# Patient Record
Sex: Female | Born: 1950 | Race: White | Hispanic: No | Smoking: Never smoker
Health system: Southern US, Community
[De-identification: ages and names within clinical notes are randomized; demographics above are authoritative.]

## PROBLEM LIST (undated history)

## (undated) DIAGNOSIS — M1990 Unspecified osteoarthritis, unspecified site: Secondary | ICD-10-CM

## (undated) DIAGNOSIS — M797 Fibromyalgia: Secondary | ICD-10-CM

## (undated) DIAGNOSIS — E039 Hypothyroidism, unspecified: Secondary | ICD-10-CM

## (undated) DIAGNOSIS — M316 Other giant cell arteritis: Secondary | ICD-10-CM

## (undated) DIAGNOSIS — M810 Age-related osteoporosis without current pathological fracture: Secondary | ICD-10-CM

## (undated) DIAGNOSIS — T7840XA Allergy, unspecified, initial encounter: Secondary | ICD-10-CM

## (undated) DIAGNOSIS — A770 Spotted fever due to Rickettsia rickettsii: Secondary | ICD-10-CM

## (undated) HISTORY — PX: BREAST SURGERY: SHX581

## (undated) HISTORY — DX: Unspecified osteoarthritis, unspecified site: M19.90

## (undated) HISTORY — DX: Spotted fever due to Rickettsia rickettsii: A77.0

## (undated) HISTORY — DX: Age-related osteoporosis without current pathological fracture: M81.0

## (undated) HISTORY — DX: Fibromyalgia: M79.7

## (undated) HISTORY — DX: Hypothyroidism, unspecified: E03.9

## (undated) HISTORY — DX: Allergy, unspecified, initial encounter: T78.40XA

## (undated) HISTORY — DX: Other giant cell arteritis: M31.6

---

## 1998-08-19 ENCOUNTER — Encounter: Payer: Self-pay | Admitting: Emergency Medicine

## 1998-08-19 ENCOUNTER — Ambulatory Visit (HOSPITAL_COMMUNITY): Admission: RE | Admit: 1998-08-19 | Discharge: 1998-08-19 | Payer: Self-pay | Admitting: Emergency Medicine

## 2000-05-09 ENCOUNTER — Encounter: Admission: RE | Admit: 2000-05-09 | Discharge: 2000-05-09 | Payer: Self-pay | Admitting: Emergency Medicine

## 2000-05-09 ENCOUNTER — Encounter: Payer: Self-pay | Admitting: Emergency Medicine

## 2000-05-31 ENCOUNTER — Encounter: Admission: RE | Admit: 2000-05-31 | Discharge: 2000-05-31 | Payer: Self-pay | Admitting: Emergency Medicine

## 2000-05-31 ENCOUNTER — Encounter: Payer: Self-pay | Admitting: Emergency Medicine

## 2000-10-03 ENCOUNTER — Encounter: Payer: Self-pay | Admitting: Neurological Surgery

## 2000-10-03 ENCOUNTER — Encounter: Admission: RE | Admit: 2000-10-03 | Discharge: 2000-10-03 | Payer: Self-pay | Admitting: Neurological Surgery

## 2001-01-30 ENCOUNTER — Encounter: Payer: Self-pay | Admitting: Emergency Medicine

## 2001-01-30 ENCOUNTER — Encounter: Admission: RE | Admit: 2001-01-30 | Discharge: 2001-01-30 | Payer: Self-pay | Admitting: Emergency Medicine

## 2001-10-27 ENCOUNTER — Encounter: Payer: Self-pay | Admitting: Emergency Medicine

## 2001-10-27 ENCOUNTER — Ambulatory Visit (HOSPITAL_COMMUNITY): Admission: RE | Admit: 2001-10-27 | Discharge: 2001-10-27 | Payer: Self-pay | Admitting: Emergency Medicine

## 2014-07-28 ENCOUNTER — Encounter: Payer: Self-pay | Admitting: Family Medicine

## 2014-07-28 ENCOUNTER — Ambulatory Visit (INDEPENDENT_AMBULATORY_CARE_PROVIDER_SITE_OTHER): Payer: Self-pay | Admitting: Family Medicine

## 2014-07-28 VITALS — BP 118/70 | HR 81 | Temp 98.3°F | Resp 16 | Ht 62.0 in | Wt 102.4 lb

## 2014-07-28 DIAGNOSIS — E039 Hypothyroidism, unspecified: Secondary | ICD-10-CM

## 2014-07-28 DIAGNOSIS — Z23 Encounter for immunization: Secondary | ICD-10-CM

## 2014-07-28 MED ORDER — LIOTHYRONINE SODIUM 25 MCG PO TABS: 25.0000 ug | ORAL_TABLET | Freq: Every day | ORAL | Status: DC

## 2014-07-28 MED ORDER — LEVOTHYROXINE SODIUM 75 MCG PO TABS: 75.0000 ug | ORAL_TABLET | Freq: Every day | ORAL | Status: DC

## 2014-07-28 NOTE — Progress Notes (Addendum)
Subjective:    Patient ID: Alyssa Grant, female    DOB: May 06, 1951, 63 y.o.   MRN: [id number] This chart was scribed for Ethelda Chick, MD by Gwenevere Abbot, ED scribe. This patient was seen in room Room/bed 21 and the patient's care was started at 9:14 AM.  HPI  HPI Comments:  Alyssa Grant is a 63 y.o. female who presents to [facility] to establish care.    Previous Provider: Dr. Lorenz Coaster; Pt scheduled appointments to check TSH levels.  Last Physical: 2010/03/07 Pap Smear: 03/07/04 Mammogram: Mar 07, 2009 Colonoscopy: Mar 07, 2000, No polyps. Bone Density: 2012-03-07, No osteopenia.  Flu Vaccine: Pt accepts flu vaccine and requesting today. Tetanus: 2006-03-07  Eye Exam: UTD, No glaucoma or cataracts Dentist: Pt attends every six months. Pt does not smoke. Pt has an occasional drink. Pt is allergic to sulfa drugs and penicillin.   Family History: Mother: Deceased at 11 y.o. h/o COPD. Pt had a oxygen depravation seizure that caused MI. CVA in late 60's and high cholesterol. Father: Deceased at 42 y.o.h/o prostate cancer, bone cancer, and lung cancer. HTN. 3 Sisters and 2 Brothers: 1 Sister deceased, who was supposed to use a C-Pap machine, and was prescribed codeine cough syrup, and passed away due to respiratory distress in her sleep. 1 sister with h/o cancer (lump behind the knee), treated with radiation chemo. Brother with a h/o obesity and HTN.  Husband: Deceased in 03-07-2012. 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   Chronic Health Issues: Seasonal Allergies: Pt takes Claritin seasonally PRN. 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 Pt takes ibuprofen PRN. Thyroid: Pt takes medication, and had done so since she was in her 30's. Pt currently takes synthroid. Pt has never had an ultra sound of her neck. Thyroid was checked by Dr. Lorenz Coaster in 2010/03/07.  Breast Surgery: Pt has a h/o cysts in her  breast. Pt denies hospitalization for any other issues.  Pt does not express any concerns for this visit, but just wanted to establish care.    Pt is a vegetarian. Pt reports that she obtains her protein from fish. Pt reports that she did consume beef, pork and chicken at one point, but became unable to do so because of her digestive system. Pt reports that she experiences diarrhea at times when she consumes peanuts.   Review of Systems  Constitutional: Negative for fever and chills.  HENT: Negative for hearing loss.   Respiratory: Negative for cough and shortness of breath.   Cardiovascular: Negative for chest pain.  Gastrointestinal: Negative for diarrhea and constipation.  Endocrine: Negative for cold intolerance, heat intolerance, polydipsia, polyphagia and polyuria.  Musculoskeletal: Positive for arthralgias.  Allergic/Immunologic: Positive for environmental allergies.  Neurological: Negative for tremors and headaches.       Objective:   Physical Exam  Nursing note and vitals reviewed. Constitutional: She is oriented to person, place, and time. She appears well-developed and well-nourished.  HENT:  Head: Normocephalic and atraumatic.  Right Ear: External ear normal.  Left Ear: External ear normal.  Eyes: EOM are normal. Pupils are equal, round, and reactive to light.  Neck: Normal range of motion. Neck supple.  Cardiovascular: Normal rate, regular rhythm and normal heart sounds.  Exam reveals no gallop and no friction rub.   No murmur heard. Pulmonary/Chest: Effort normal and breath sounds normal. No respiratory distress. She  has no wheezes. She has no rales.  Abdominal: Soft. Bowel sounds are normal. She exhibits no distension and no mass. There is no tenderness. There is no rebound and no guarding.  Musculoskeletal: Normal range of motion.  Neurological: She is alert and oriented to person, place, and time. No cranial nerve deficit. She exhibits normal muscle tone.  Coordination normal.  Skin: Skin is warm and dry.  Psychiatric: She has a normal mood and affect. Her behavior is normal.    INFLUENZA VACCINE ADMINISTERED.   Assessment & Plan:  1. Need for prophylactic vaccination and inoculation against influenza - Flu Vaccine QUAD 36+ mos IM administered in office.  2. Unspecified hypothyroidism -Controlled; will plan to obtain labs when Medicare becomes effective in three months.  Refills provided. - levothyroxine (SYNTHROID, LEVOTHROID) 75 MCG tablet; Take 1 tablet (75 mcg total) by mouth daily before breakfast.  Dispense: 30 tablet; Refill: 5 - liothyronine (CYTOMEL) 25 MCG tablet; Take 1 tablet (25 mcg total) by mouth daily.  Dispense: 30 tablet; Refill: 5  I personally performed the services described in this documentation, which was scribed in my presence.  The recorded information has been reviewed and is accurate.  Alyssa Grant, M.D.  [facility]  [place] [address] [place], [place]  [address] [phone] phone [phone] fax

## 2014-11-08 ENCOUNTER — Encounter: Payer: Self-pay | Admitting: Family Medicine

## 2014-12-07 ENCOUNTER — Other Ambulatory Visit: Payer: Self-pay

## 2014-12-07 DIAGNOSIS — E039 Hypothyroidism, unspecified: Secondary | ICD-10-CM

## 2014-12-07 MED ORDER — LEVOTHYROXINE SODIUM 75 MCG PO TABS: 75.0000 ug | ORAL_TABLET | Freq: Every day | ORAL | Status: DC

## 2014-12-07 NOTE — Telephone Encounter (Signed)
Pt has Mychart CPE sch w/you for 12/27/14. Do you want to OK 90 day RF?

## 2014-12-07 NOTE — Telephone Encounter (Signed)
Pt states a fax will be coming for refill for her thyroid meds,it will say for 30 days,but she is requesting it for 90 days since she will be taking it for lifetime    [facility] phone [phone]    Pharmacy [facility]

## 2014-12-07 NOTE — Telephone Encounter (Signed)
Ninety day supply approved and escribed to pharmacy.

## 2014-12-27 ENCOUNTER — Other Ambulatory Visit: Payer: Self-pay | Admitting: Family Medicine

## 2014-12-27 ENCOUNTER — Ambulatory Visit (INDEPENDENT_AMBULATORY_CARE_PROVIDER_SITE_OTHER): Payer: PPO | Admitting: Family Medicine

## 2014-12-27 ENCOUNTER — Encounter: Payer: Self-pay | Admitting: Family Medicine

## 2014-12-27 VITALS — BP 114/74 | HR 89 | Temp 98.1°F | Resp 16 | Ht 62.0 in | Wt 102.0 lb

## 2014-12-27 DIAGNOSIS — E039 Hypothyroidism, unspecified: Secondary | ICD-10-CM

## 2014-12-27 DIAGNOSIS — Z1231 Encounter for screening mammogram for malignant neoplasm of breast: Secondary | ICD-10-CM

## 2014-12-27 DIAGNOSIS — Z01419 Encounter for gynecological examination (general) (routine) without abnormal findings: Secondary | ICD-10-CM

## 2014-12-27 DIAGNOSIS — Z124 Encounter for screening for malignant neoplasm of cervix: Secondary | ICD-10-CM | POA: Diagnosis not present

## 2014-12-27 DIAGNOSIS — Z0000 Encounter for general adult medical examination without abnormal findings: Secondary | ICD-10-CM

## 2014-12-27 DIAGNOSIS — Z131 Encounter for screening for diabetes mellitus: Secondary | ICD-10-CM | POA: Diagnosis not present

## 2014-12-27 DIAGNOSIS — E038 Other specified hypothyroidism: Secondary | ICD-10-CM | POA: Diagnosis not present

## 2014-12-27 DIAGNOSIS — R636 Underweight: Secondary | ICD-10-CM | POA: Diagnosis not present

## 2014-12-27 DIAGNOSIS — Z1211 Encounter for screening for malignant neoplasm of colon: Secondary | ICD-10-CM | POA: Diagnosis not present

## 2014-12-27 DIAGNOSIS — Z13220 Encounter for screening for lipoid disorders: Secondary | ICD-10-CM

## 2014-12-27 DIAGNOSIS — Z23 Encounter for immunization: Secondary | ICD-10-CM | POA: Diagnosis not present

## 2014-12-27 DIAGNOSIS — E034 Atrophy of thyroid (acquired): Secondary | ICD-10-CM | POA: Diagnosis not present

## 2014-12-27 DIAGNOSIS — Z780 Asymptomatic menopausal state: Secondary | ICD-10-CM | POA: Diagnosis not present

## 2014-12-27 DIAGNOSIS — Z1239 Encounter for other screening for malignant neoplasm of breast: Secondary | ICD-10-CM | POA: Diagnosis not present

## 2014-12-27 LAB — COMPREHENSIVE METABOLIC PANEL
ALT: 13 U/L (ref 0–35)
AST: 19 U/L (ref 0–37)
Albumin: 3.9 g/dL (ref 3.5–5.2)
Alkaline Phosphatase: 64 U/L (ref 39–117)
BUN: 12 mg/dL (ref 6–23)
CO2: 28 mEq/L (ref 19–32)
Calcium: 9.1 mg/dL (ref 8.4–10.5)
Chloride: 105 mEq/L (ref 96–112)
Creat: 0.63 mg/dL (ref 0.50–1.10)
GLUCOSE: 87 mg/dL (ref 70–99)
Potassium: 4.6 mEq/L (ref 3.5–5.3)
SODIUM: 138 meq/L (ref 135–145)
TOTAL PROTEIN: 6.8 g/dL (ref 6.0–8.3)
Total Bilirubin: 0.7 mg/dL (ref 0.2–1.2)

## 2014-12-27 LAB — CBC WITH DIFFERENTIAL/PLATELET
BASOS ABS: 0.1 10*3/uL (ref 0.0–0.1)
Basophils Relative: 1 % (ref 0–1)
Eosinophils Absolute: 0.1 10*3/uL (ref 0.0–0.7)
Eosinophils Relative: 2 % (ref 0–5)
HCT: 39.7 % (ref 36.0–46.0)
HEMOGLOBIN: 13.3 g/dL (ref 12.0–15.0)
LYMPHS ABS: 1.8 10*3/uL (ref 0.7–4.0)
LYMPHS PCT: 29 % (ref 12–46)
MCH: 31.3 pg (ref 26.0–34.0)
MCHC: 33.5 g/dL (ref 30.0–36.0)
MCV: 93.4 fL (ref 78.0–100.0)
MONO ABS: 0.6 10*3/uL (ref 0.1–1.0)
MONOS PCT: 10 % (ref 3–12)
MPV: 10 fL (ref 8.6–12.4)
NEUTROS ABS: 3.7 10*3/uL (ref 1.7–7.7)
Neutrophils Relative %: 58 % (ref 43–77)
Platelets: 174 10*3/uL (ref 150–400)
RBC: 4.25 MIL/uL (ref 3.87–5.11)
RDW: 13 % (ref 11.5–15.5)
WBC: 6.3 10*3/uL (ref 4.0–10.5)

## 2014-12-27 LAB — TSH: TSH: 1.099 u[IU]/mL (ref 0.350–4.500)

## 2014-12-27 LAB — POCT URINALYSIS DIPSTICK
Bilirubin, UA: NEGATIVE
Glucose, UA: NEGATIVE
KETONES UA: NEGATIVE
Leukocytes, UA: NEGATIVE
Nitrite, UA: NEGATIVE
Protein, UA: NEGATIVE
Spec Grav, UA: 1.015
Urobilinogen, UA: 0.2
pH, UA: 6

## 2014-12-27 LAB — T4, FREE: Free T4: 0.77 ng/dL — ABNORMAL LOW (ref 0.80–1.80)

## 2014-12-27 MED ORDER — LIOTHYRONINE SODIUM 25 MCG PO TABS: 25.0000 ug | ORAL_TABLET | Freq: Every day | ORAL | Status: DC

## 2014-12-27 MED ORDER — ZOSTER VACCINE LIVE 19400 UNT/0.65ML ~~LOC~~ SOLR: 0.6500 mL | Freq: Once | SUBCUTANEOUS | Status: DC

## 2014-12-27 MED ORDER — LEVOTHYROXINE SODIUM 75 MCG PO TABS: 75.0000 ug | ORAL_TABLET | Freq: Every day | ORAL | Status: DC

## 2014-12-27 NOTE — Progress Notes (Addendum)
Subjective:    Patient ID: Alyssa Grant, female    DOB: 1951/01/07, 64 y.o.   MRN: [id number]  12/27/2014  Annual Exam   HPI This 64 y.o. female presents for Welcome to [facility] Physical.   Previous Provider: Dr. Lorenz Coaster; Pt scheduled appointments to check TSH levels.  Last Physical: 2011 Pap Smear: 2005; menopause age 63.    No history of abnormal pap smears. Mammogram: 2010; history of fibrocystic disease.  S/p cysts and fibroadenomas.   Colonoscopy: 2001, No polyps. Refuses another colonoscopy.  Spastic colon.  Agreeable to stool kits yearly. Bone Density: 2013, No osteopenia. Heel screening.  3 servings of dairy daily; takes Calcium/Mg/Zinc; eats yogurt every morning; drinks milk; eats cheese.  Vegetarian.  Eats fish. Flu Vaccine: 06/2014 Tetanus: 2007  Pneumovax never: requesting today.   Zostavax never.   Eye Exam: UTD, No glaucoma or cataracts Dentist: Pt attends every six months.  Pt does not smoke. Pt has an occasional drink. Pt is allergic to sulfa drugs and penicillin.    Community acquired pneumonia: in 07/2014; evaluated by Dr. Lazarus Salines; prescribed Levaquin  for ten days; sufferd with horrible arthralgias.   Double pneumonia.    Hypothyroidism:  Patient reports good compliance with medication, good tolerance to medication, and good symptom control.    Review of Systems  Constitutional: Negative for fever, chills, diaphoresis, activity change, appetite change, fatigue and unexpected weight change.  HENT: Negative for congestion, dental problem, drooling, ear discharge, ear pain, facial swelling, hearing loss, mouth sores, nosebleeds, postnasal drip, rhinorrhea, sinus pressure, sneezing, sore throat, tinnitus, trouble swallowing and voice change.   Eyes: Negative for photophobia, pain, discharge, redness, itching and visual disturbance.  Respiratory: Negative for apnea, cough, choking, chest tightness, shortness of breath, wheezing and stridor.     Cardiovascular: Negative for chest pain, palpitations and leg swelling.  Gastrointestinal: Negative for nausea, vomiting, abdominal pain, diarrhea, constipation, blood in stool, abdominal distention, anal bleeding and rectal pain.  Endocrine: Negative for cold intolerance, heat intolerance, polydipsia, polyphagia and polyuria.  Genitourinary: Negative for dysuria, urgency, frequency, hematuria, flank pain, decreased urine volume, vaginal bleeding, vaginal discharge, enuresis, difficulty urinating, genital sores, vaginal pain, menstrual problem, pelvic pain and dyspareunia.  Musculoskeletal: Negative for myalgias, back pain, joint swelling, arthralgias, gait problem, neck pain and neck stiffness.  Skin: Negative for color change, pallor, rash and wound.  Allergic/Immunologic: Negative for environmental allergies, food allergies and immunocompromised state.  Neurological: Negative for dizziness, tremors, seizures, syncope, facial asymmetry, speech difficulty, weakness, light-headedness, numbness and headaches.  Hematological: Negative for adenopathy. Does not bruise/bleed easily.  Psychiatric/Behavioral: Negative for suicidal ideas, hallucinations, behavioral problems, confusion, sleep disturbance, self-injury, dysphoric mood, decreased concentration and agitation. The patient is not nervous/anxious and is not hyperactive.     Past Medical History  Diagnosis Date  . Allergy     Claritin; seasonal.  . Arthritis     Hands, hips, knees.  Ibuprofen PRN.  Marland Kitchen Thyroid disease     goiter; onset age 89.   Past Surgical History  Procedure Laterality Date  . Breast surgery      breast cyst resection; benign.  x 2.   Allergies  Allergen Reactions  . Levaquin [Levofloxacin]   . Penicillins     Rash/swelling  . Sulfa Antibiotics     rash   Current Outpatient Prescriptions  Medication Sig Dispense Refill  . vitamin E 400 UNIT capsule Take 400 Units by mouth daily.    Marland Kitchen levothyroxine (SYNTHROID,  LEVOTHROID) 75  MCG tablet Take 1 tablet (75 mcg total) by mouth daily before breakfast. 90 tablet 3  . liothyronine (CYTOMEL) 25 MCG tablet Take 1 tablet (25 mcg total) by mouth daily. 90 tablet 3  . Multiple Vitamin (MULTIVITAMIN) tablet Take 1 tablet by mouth daily.    Marland Kitchen OVER THE COUNTER MEDICATION OTC Fish Oil 320 mg EPA, 200 mg DHA taking daily    . OVER THE COUNTER MEDICATION Magnesium Malate 1250 mg taking daily    . OVER THE COUNTER MEDICATION Vitamin D 1000 iu taking daily    . OVER THE COUNTER MEDICATION Biotin 5000 mcg taking daily    . OVER THE COUNTER MEDICATION Aspirin 81 mg taking daily    . zoster vaccine live, PF, (ZOSTAVAX) 16109 UNT/0.65ML injection Inject 19,400 Units into the skin once. 0.65 mL 0   No current facility-administered medications for this visit.   History   Social History  . Marital Status: Married    Spouse Name: N/A  . Number of Children: N/A  . Years of Education: N/A   Occupational History  . Not on file.   Social History Main Topics  . Smoking status: Never Smoker   . Smokeless tobacco: Not on file  . Alcohol Use: No  . Drug Use: No  . Sexual Activity: No   Other Topics Concern  . Not on file   Social History Narrative   Marital status:  Widowed; husband died suddenly (MS, pacemaker, atrial fibrillation on Coumadin; head trauma with bleed s/p emergency surgery with persistent bleeding).  2013.        Children:  1 daughter; 1 stepson; 2 stepgrandchildren.      Lives: alone; has dog and cat.      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        Tobacco:  never       Alcohol:  Rarely; holidays      Exercise:  Walking 4 miles per day.      Seatbelts:  100%      Guns:  None      ADLs:  Independent with all ADLs; drives/cooks/finances.  No assistant devices.        Advanced Directives:  Living Will; HCPOA is daughter/[name]  [phone]); FULL CODE but no prolonged measures.              Family History  Problem Relation Age of Onset  . Heart disease Mother 32    AMI x 2  . Hyperlipidemia Mother   . COPD Mother   . Stroke Mother 21  . Cancer Father     prostate cancer; lung cancer  . Heart disease Father   . Hyperlipidemia Father   . Hypertension Father   . Alcohol abuse Father   . Cancer Sister 20    bone cancer? s/p radiation and chemotherapy  . Hyperlipidemia Sister   . Hypertension Sister   . Diabetes Brother   . Hyperlipidemia Brother   . Hypertension Brother   . Stroke Brother   . Heart disease Brother   . Hyperlipidemia Brother   . Hypertension Brother   . Stroke Brother   . Diabetes Sister   . Hyperlipidemia Sister   . Hypertension Sister        Objective:    BP 114/74 mmHg  Pulse 89  Temp(Src) 98.1 F (36.7 C)  Resp 16  Ht 5'  2" (1.575 m)  Wt 102 lb (46.267 kg)  BMI 18.65 kg/m2  SpO2 98% Physical Exam  Constitutional: She is oriented to person, place, and time. She appears well-developed and well-nourished. No distress.  HENT:  Head: Normocephalic and atraumatic.  Right Ear: External ear normal.  Left Ear: External ear normal.  Nose: Nose normal.  Mouth/Throat: Oropharynx is clear and moist.  Eyes: Conjunctivae and EOM are normal. Pupils are equal, round, and reactive to light.  Neck: Normal range of motion and full passive range of motion without pain. Neck supple. No JVD present. Carotid bruit is not present. No thyromegaly present.  Cardiovascular: Normal rate, regular rhythm and normal heart sounds.  Exam reveals no gallop and no friction rub.   No murmur heard. Pulmonary/Chest: Effort normal and breath sounds normal. She has no wheezes. She has no rales. Right breast exhibits no inverted nipple, no mass, no nipple discharge, no skin change and no tenderness. Left breast exhibits no inverted nipple, no mass, no nipple discharge, no skin change and no tenderness.  Breasts are symmetrical.  Abdominal: Soft. Bowel sounds are normal. She exhibits no distension and no mass. There is no tenderness. There is no rebound and no guarding.  Genitourinary: Rectum normal, vagina normal and uterus normal. There is no rash, tenderness, lesion or injury on the right labia. There is no rash, tenderness, lesion or injury on the left labia. Cervix exhibits no motion tenderness, no discharge and no friability. Right adnexum displays no mass, no tenderness and no fullness. Left adnexum displays no mass, no tenderness and no fullness. No erythema, tenderness or bleeding in the vagina. No foreign body around the vagina. No signs of injury around the vagina. No vaginal discharge found.  Musculoskeletal:       Right shoulder: Normal.       Left shoulder: Normal.       Cervical back: Normal.  Lymphadenopathy:    She has no cervical adenopathy.  Neurological: She is alert and oriented to person, place, and time. She has normal reflexes. No cranial nerve deficit. She exhibits normal muscle tone. Coordination normal.  Skin: Skin is warm and dry. No rash noted. She is not diaphoretic. No erythema. No pallor.  Psychiatric: She has a normal mood and affect. Her behavior is normal. Judgment and thought content normal.  Nursing note and vitals reviewed.  Results for orders placed or performed in visit on 12/27/14  POCT urinalysis dipstick  Result Value Ref Range   Color, UA yellow    Clarity, UA clear    Glucose, UA neg    Bilirubin, UA neg    Ketones, UA neg    Spec Grav, UA 1.015    Blood, UA trace    pH, UA 6.0    Protein, UA neg    Urobilinogen, UA 0.2    Nitrite, UA neg    Leukocytes, UA Negative    PREVNAR 13 ADMINISTERED.  EKG: NSR; NO ST CHANGES.    Assessment & Plan:   1. Medicare annual wellness visit, initial   2. Need for prophylactic vaccination against Streptococcus pneumoniae (pneumococcus)   3. Hypothyroidism due to acquired atrophy of thyroid   4.  Screening, lipid   5. Screening for diabetes mellitus   6. Need for shingles vaccine   7. Screening for breast cancer   8. Cervical cancer screening   9. Encounter for routine gynecological examination   10. Colon cancer screening   11. Postmenopausal   12. Hypothyroidism, unspecified  hypothyroidism type      1. Welcome to Medicare Physical:  Anticipatory guidance-- exercise, weight maintenance and encourage weight gain.  Pap smear obtained; refer for mammogram; refer for bone density.  Stool cards provided for home completion; pt refuses repeat colonoscopy.  S/p Prevnar-13 in office; rx for Zostavax provided.  No falls.  No evidence of depression or hearing loss or urinary incontinence.  Has advanced directives. 2. Gynecological exam: pap smear obtained; refer for mammogram.  Menopausal. 3.  Hypothyroidism: stable; obtain labs; refills provided.  RTC one year. 4.  Screening lipid: obtain FLP. 5.  Screening diabetes: obtain glucose. 6. Screening osteoporosis: obtain DEXA scan; continue 3 servings of dairy daily. 7. Underweight: recommend weight gain; weight is stable over past six months.    Meds ordered this encounter  Medications  . vitamin E 400 UNIT capsule    Sig: Take 400 Units by mouth daily.  Marland Kitchen. zoster vaccine live, PF, (ZOSTAVAX) [id number] UNT/0.65ML injection    Sig: Inject 19,400 Units into the skin once.    Dispense:  0.65 mL    Refill:  0  . levothyroxine (SYNTHROID, LEVOTHROID) 75 MCG tablet    Sig: Take 1 tablet (75 mcg total) by mouth daily before breakfast.    Dispense:  90 tablet    Refill:  3  . liothyronine (CYTOMEL) 25 MCG tablet    Sig: Take 1 tablet (25 mcg total) by mouth daily.    Dispense:  90 tablet    Refill:  3    Return in about 1 year (around 12/27/2015) for complete physical examiniation.    Krithi Bray Paulita FujitaMartin Marisal Swarey, M.D. [facility]  [place] [address] [place], [place]  [address] [phone] phone [phone] fax

## 2014-12-27 NOTE — Patient Instructions (Signed)

## 2014-12-28 ENCOUNTER — Encounter: Payer: Self-pay | Admitting: Family Medicine

## 2014-12-28 DIAGNOSIS — R636 Underweight: Secondary | ICD-10-CM | POA: Insufficient documentation

## 2014-12-28 DIAGNOSIS — E039 Hypothyroidism, unspecified: Secondary | ICD-10-CM | POA: Insufficient documentation

## 2014-12-28 LAB — PAP IG (IMAGE GUIDED)

## 2015-01-01 ENCOUNTER — Telehealth: Payer: Self-pay

## 2015-01-01 LAB — LIPID PANEL
Cholesterol: 179 mg/dL (ref 0–200)
HDL: 85 mg/dL (ref >=46)
LDL Cholesterol: 84 mg/dL (ref 0–99)
Total CHOL/HDL Ratio: 2.1 Ratio
Triglycerides: 49 mg/dL (ref <150)
VLDL: 10 mg/dL (ref 0–40)

## 2015-01-01 NOTE — Addendum Note (Signed)
Addended by: Johnnette LitterARDWELL, Camiyah Friberg M on: 01/01/2015 03:37 PM   Modules accepted: Kipp BroodSmartSet

## 2015-01-01 NOTE — Telephone Encounter (Signed)
Pt would like to check on the status of bone density referral. Thanks

## 2015-01-04 NOTE — Addendum Note (Signed)
Addended by: Ethelda ChickSMITH, Bianey Tesoro M on: 01/04/2015 08:30 AM   Modules accepted: Orders

## 2015-01-17 NOTE — Telephone Encounter (Signed)
Pharmacy only filled a 30 day refill on liothyronine (CYTOMEL) 25 MCG tablet   [phone]

## 2015-01-18 ENCOUNTER — Other Ambulatory Visit: Payer: Self-pay | Admitting: Family Medicine

## 2015-01-18 DIAGNOSIS — E039 Hypothyroidism, unspecified: Secondary | ICD-10-CM

## 2015-01-18 NOTE — Telephone Encounter (Signed)
Referrals, can you please check on this referral if you haven't already done so? thanks

## 2015-01-18 NOTE — Telephone Encounter (Signed)
[facility] Dr Katrinka BlazingSmith had sent in a 90 day supply w/RFs on 12/27/14 and advised her to check w/pharmacy to make sure they have that one on file.

## 2015-01-27 ENCOUNTER — Ambulatory Visit
Admission: RE | Admit: 2015-01-27 | Discharge: 2015-01-27 | Disposition: A | Discharge status: Home / Self Care | Payer: PPO | Source: Ambulatory Visit | Attending: Family Medicine | Admitting: Family Medicine

## 2015-01-27 DIAGNOSIS — Z1231 Encounter for screening mammogram for malignant neoplasm of breast: Secondary | ICD-10-CM

## 2015-01-27 DIAGNOSIS — E039 Hypothyroidism, unspecified: Secondary | ICD-10-CM

## 2015-02-21 ENCOUNTER — Telehealth: Payer: Self-pay

## 2015-02-21 ENCOUNTER — Telehealth: Payer: Self-pay | Admitting: *Deleted

## 2015-02-21 DIAGNOSIS — E034 Atrophy of thyroid (acquired): Secondary | ICD-10-CM

## 2015-02-21 NOTE — Telephone Encounter (Signed)
Pt called in regards to lab results and bone density results. Reports she had been out of town. Wanted to know if she needed a office appointment in regards to a recheck on her thyroid or if that could just be a lab visit. Advised patient i would check with you first .

## 2015-02-21 NOTE — Telephone Encounter (Signed)
Patient was scheduled to come back in to see Dr Katrinka BlazingSmith on 05/04/15 for follow up on Hypothyroidism at 3 pm and she rescheduled to 06/06/15. Patient wants to know if she can just come in when she is avaliable and have her labs drawn or does she have to see Dr Katrinka BlazingSmith? She is also requesting her Bone Density results. Patients call back number is [phone]

## 2015-02-22 NOTE — Telephone Encounter (Signed)
lmom to cb. 

## 2015-02-22 NOTE — Telephone Encounter (Signed)
1.  Patient does not need to see me; she just needs repeat labs in July.  I have placed a lab order; she can present to 104 any time in July or August for repeat labs.  2. Please give patient bone density results (imaging).

## 2015-02-23 NOTE — Telephone Encounter (Signed)
No answer, no voicemail.

## 2015-02-23 NOTE — Telephone Encounter (Signed)
Patient has been notified about coming in for just labs only in July or august by me. Per patient she was notified of her bone density results yesterday by clinical staff.

## 2015-02-24 ENCOUNTER — Encounter: Payer: Self-pay | Admitting: Radiology

## 2015-05-04 ENCOUNTER — Ambulatory Visit: Payer: Self-pay | Admitting: Family Medicine

## 2015-06-03 ENCOUNTER — Other Ambulatory Visit (INDEPENDENT_AMBULATORY_CARE_PROVIDER_SITE_OTHER): Payer: PPO | Admitting: Family Medicine

## 2015-06-03 DIAGNOSIS — E038 Other specified hypothyroidism: Secondary | ICD-10-CM

## 2015-06-03 DIAGNOSIS — E034 Atrophy of thyroid (acquired): Secondary | ICD-10-CM | POA: Diagnosis not present

## 2015-06-03 LAB — TSH: TSH: 0.532 u[IU]/mL (ref 0.350–4.500)

## 2015-06-03 LAB — T4, FREE: Free T4: 0.79 ng/dL — ABNORMAL LOW (ref 0.80–1.80)

## 2015-06-03 NOTE — Progress Notes (Signed)
Patient is here for lab draw only

## 2015-06-06 ENCOUNTER — Ambulatory Visit: Payer: Self-pay | Admitting: Family Medicine

## 2015-07-18 ENCOUNTER — Ambulatory Visit (INDEPENDENT_AMBULATORY_CARE_PROVIDER_SITE_OTHER): Payer: PPO | Admitting: Family Medicine

## 2015-07-18 ENCOUNTER — Other Ambulatory Visit: Payer: Self-pay

## 2015-07-18 ENCOUNTER — Encounter: Payer: Self-pay | Admitting: Family Medicine

## 2015-07-18 VITALS — BP 121/78 | HR 78 | Temp 98.4°F | Resp 16 | Ht 62.0 in | Wt 102.0 lb

## 2015-07-18 DIAGNOSIS — Z23 Encounter for immunization: Secondary | ICD-10-CM

## 2015-07-18 DIAGNOSIS — E038 Other specified hypothyroidism: Secondary | ICD-10-CM

## 2015-07-18 DIAGNOSIS — M810 Age-related osteoporosis without current pathological fracture: Secondary | ICD-10-CM | POA: Diagnosis not present

## 2015-07-18 DIAGNOSIS — R636 Underweight: Secondary | ICD-10-CM

## 2015-07-18 DIAGNOSIS — M19041 Primary osteoarthritis, right hand: Secondary | ICD-10-CM

## 2015-07-18 DIAGNOSIS — E034 Atrophy of thyroid (acquired): Secondary | ICD-10-CM | POA: Diagnosis not present

## 2015-07-18 DIAGNOSIS — M19042 Primary osteoarthritis, left hand: Secondary | ICD-10-CM

## 2015-07-18 NOTE — Progress Notes (Signed)
Subjective:    Patient ID: Alyssa Grant, female    DOB: 07/17/1951, 64 y.o.   MRN: [id number]  07/18/2015  Flu Vaccine and Follow-up   HPI This 64 y.o. female presents for six month follow-up:   Hands and hips OA: has started taking Tumeric.  Was drinking chart cherry juice.    Osteoporosis: took Fosamax in the past and did not agee with patient; had horrible indigestion and chest pain.  Dr. Lorenz Grant passed away.  Getting calcium in.  Exercises daily.  Has dog; mix breed; thirteen.  Eats a lot of calcium.  Vitamin D3 supplement.  Tons of yard work.  Has one acre.  Hypothyroidism: Patient reports good compliance with medication, good tolerance to medication, and good symptom control.     Review of Systems  Constitutional: Negative for fever, chills, diaphoresis and fatigue.  Eyes: Negative for visual disturbance.  Respiratory: Negative for cough and shortness of breath.   Cardiovascular: Negative for chest pain, palpitations and leg swelling.  Gastrointestinal: Negative for nausea, vomiting, abdominal pain, diarrhea and constipation.  Endocrine: Negative for cold intolerance, heat intolerance, polydipsia, polyphagia and polyuria.  Musculoskeletal: Positive for arthralgias.  Neurological: Negative for dizziness, tremors, seizures, syncope, facial asymmetry, speech difficulty, weakness, light-headedness, numbness and headaches.    Past Medical History  Diagnosis Date  . Allergy     Claritin; seasonal.  . Arthritis     Hands, hips, knees.  Ibuprofen PRN.  Marland Kitchen Hypothyroidism     goiter; onset age 50.   Past Surgical History  Procedure Laterality Date  . Breast surgery      breast cyst resection; benign.  x 2.   Allergies  Allergen Reactions  . Levaquin [Levofloxacin]   . Penicillins     Rash/swelling  . Sulfa Antibiotics     rash   Current Outpatient Prescriptions  Medication Sig Dispense Refill  . levothyroxine (SYNTHROID, LEVOTHROID) 75 MCG tablet Take 1 tablet  (75 mcg total) by mouth daily before breakfast. 90 tablet 3  . liothyronine (CYTOMEL) 25 MCG tablet Take 1 tablet (25 mcg total) by mouth daily. 90 tablet 3  . Multiple Vitamin (MULTIVITAMIN) tablet Take 1 tablet by mouth daily.    Marland Kitchen OVER THE COUNTER MEDICATION OTC Fish Oil 320 mg EPA, 200 mg DHA taking daily    . OVER THE COUNTER MEDICATION Magnesium Malate 1250 mg taking daily    . OVER THE COUNTER MEDICATION Vitamin D 1000 iu taking daily    . OVER THE COUNTER MEDICATION Biotin 5000 mcg taking daily    . OVER THE COUNTER MEDICATION Aspirin 81 mg taking daily    . vitamin E 400 UNIT capsule Take 400 Units by mouth daily.    Marland Kitchen zoster vaccine live, PF, (ZOSTAVAX) 13244 UNT/0.65ML injection Inject 19,400 Units into the skin once. 0.65 mL 0   No current facility-administered medications for this visit.   Social History   Social History  . Marital Status: Married    Spouse Name: Widowed  . Number of Children: 1  . Years of Education: N/A   Occupational History  . housecleaning     self employed; works 40 hours per week.   Social History Main Topics  . Smoking status: Never Smoker   . Smokeless tobacco: Never Used  . Alcohol Use: No  . Drug Use: No  . Sexual Activity:    Partners: Male   Other Topics Concern  . Not on file   Social History Narrative  Marital status:  Widowed; husband died suddenly (MS, pacemaker, atrial fibrillation on Coumadin; head trauma with bleed s/p emergency surgery with persistent bleeding).  2013.        Children:  1 daughter; 1 stepson; 2 stepgrandchildren.      Lives: alone; has dog and cat.      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        Tobacco:  never       Alcohol:  Rarely; holidays      Exercise:  Walking 4 miles per day.      Seatbelts:  100%      Guns:  None      ADLs:  Independent with all ADLs; drives/cooks/finances.  No assistant  devices.        Advanced Directives:  Living Will; HCPOA is daughter/Alyssa Grant [phone]); FULL CODE but no prolonged measures.              Family History  Problem Relation Age of Onset  . Heart disease Mother 36    AMI x 2  . Hyperlipidemia Mother   . COPD Mother   . Stroke Mother 29  . Cancer Father     prostate cancer; lung cancer  . Heart disease Father   . Hyperlipidemia Father   . Hypertension Father   . Alcohol abuse Father   . Cancer Sister 20    bone cancer? s/p radiation and chemotherapy  . Hyperlipidemia Sister   . Hypertension Sister   . Diabetes Brother   . Hyperlipidemia Brother   . Hypertension Brother   . Stroke Brother   . Heart disease Brother   . Hyperlipidemia Brother   . Hypertension Brother   . Stroke Brother   . Diabetes Sister   . Hyperlipidemia Sister   . Hypertension Sister        Objective:    BP 121/78 mmHg  Pulse 78  Temp(Src) 98.4 F (36.9 C) (Oral)  Resp 16  Ht  (1.575 m)  Wt 102 lb (46.267 kg)  BMI 18.65 kg/m2 Physical Exam  Constitutional: She is oriented to person, place, and time. She appears well-developed and well-nourished. No distress.  HENT:  Head: Normocephalic and atraumatic.  Right Ear: External ear normal.  Left Ear: External ear normal.  Nose: Nose normal.  Mouth/Throat: Oropharynx is clear and moist.  Eyes: Conjunctivae and EOM are normal. Pupils are equal, round, and reactive to light.  Neck: Normal range of motion. Neck supple. Carotid bruit is not present. No thyromegaly present.  Cardiovascular: Normal rate, regular rhythm, normal heart sounds and intact distal pulses.  Exam reveals no gallop and no friction rub.   No murmur heard. Pulmonary/Chest: Effort normal and breath sounds normal. She has no wheezes. She has no rales.  Abdominal: Soft. Bowel sounds are normal. She exhibits no distension and no mass. There is no tenderness. There is no rebound and no guarding.  Lymphadenopathy:     She has no cervical adenopathy.  Neurological: She is alert and oriented to person, place, and time. No cranial nerve deficit.  Skin: Skin is warm and dry. No rash noted. She is not diaphoretic. No erythema. No pallor.  Psychiatric: She has a normal mood and affect. Her behavior is normal.   Results for orders placed or performed in visit on 06/03/15  T4, free  Result Value Ref Range   Free  T4 0.79 (L) 0.80 - 1.80 ng/dL  TSH  Result Value Ref Range   TSH 0.532 0.350 - 4.500 uIU/mL   INFLUENZA VACCINE ADMINISTERED.    Assessment & Plan:   1. Hypothyroidism due to acquired atrophy of thyroid   2. Osteoporosis   3. Need for influenza vaccination   4. Underweight     1. Hypothyroidism: controlled; no changes to management. 2.  Osteoporosis: uncontrolled; intolerant to Fosamax in past but greater than ten years ago; desires current treatment including calcium, vitamin D, daily exercise.  Repeat bone density scan in 2 years. 3.  Underweight; weight is stable for entire adult life; continue to monitor closely. 4.  S/p flu vaccine in office. 5. OA hands: chronic; suffers with daily pain; continue tumeric.  Orders Placed This Encounter  Procedures  . Flu Vaccine QUAD 36+ mos IM   No orders of the defined types were placed in this encounter.    No Follow-up on file.    Kristi Paulita Fujita, M.D. [facility] [address] [place], [place]  [address] [phone] phone [phone] fax

## 2015-07-18 NOTE — Patient Instructions (Signed)
Osteoporosis Throughout your life, your body breaks down old bone and replaces it with new bone. As you get older, your body does not replace bone as quickly as it breaks it down. By the age of 30 years, most people begin to gradually lose bone because of the imbalance between bone loss and replacement. Some people lose more bone than others. Bone loss beyond a specified normal degree is considered osteoporosis.  Osteoporosis affects the strength and durability of your bones. The inside of the ends of your bones and your flat bones, like the bones of your pelvis, look like honeycomb, filled with tiny open spaces. As bone loss occurs, your bones become less dense. This means that the open spaces inside your bones become bigger and the walls between these spaces become thinner. This makes your bones weaker. Bones of a person with osteoporosis can become so weak that they can break (fracture) during minor accidents, such as a simple fall. CAUSES  The following factors have been associated with the development of osteoporosis:  Smoking.  Drinking more than 2 alcoholic drinks several days per week.  Long-term use of certain medicines:  Corticosteroids.  Chemotherapy medicines.  Thyroid medicines.  Antiepileptic medicines.  Gonadal hormone suppression medicine.  Immunosuppression medicine.  Being underweight.  Lack of physical activity.  Lack of exposure to the sun. This can lead to vitamin D deficiency.  Certain medical conditions:  Certain inflammatory bowel diseases, such as Crohn disease and ulcerative colitis.  Diabetes.  Hyperthyroidism.  Hyperparathyroidism. RISK FACTORS Anyone can develop osteoporosis. However, the following factors can increase your risk of developing osteoporosis:  Gender--Women are at higher risk than men.  Age--Being older than 50 years increases your risk.  Ethnicity--White and Asian people have an increased risk.  Weight --Being extremely  underweight can increase your risk of osteoporosis.  Family history of osteoporosis--Having a family member who has developed osteoporosis can increase your risk. SYMPTOMS  Usually, people with osteoporosis have no symptoms.  DIAGNOSIS  Signs during a physical exam that may prompt your caregiver to suspect osteoporosis include:  Decreased height. This is usually caused by the compression of the bones that form your spine (vertebrae) because they have weakened and become fractured.  A curving or rounding of the upper back (kyphosis). To confirm signs of osteoporosis, your caregiver may request a procedure that uses 2 low-dose X-ray beams with different levels of energy to measure your bone mineral density (dual-energy X-ray absorptiometry [DXA]). Also, your caregiver may check your level of vitamin D. TREATMENT  The goal of osteoporosis treatment is to strengthen bones in order to decrease the risk of bone fractures. There are different types of medicines available to help achieve this goal. Some of these medicines work by slowing the processes of bone loss. Some medicines work by increasing bone density. Treatment also involves making sure that your levels of calcium and vitamin D are adequate. PREVENTION  There are things you can do to help prevent osteoporosis. Adequate intake of calcium and vitamin D can help you achieve optimal bone mineral density. Regular exercise can also help, especially resistance and weight-bearing activities. If you smoke, quitting smoking is an important part of osteoporosis prevention. MAKE SURE YOU:  Understand these instructions.  Will watch your condition.  Will get help right away if you are not doing well or get worse. FOR MORE INFORMATION www.osteo.org and www.nof.org Document Released: 07/25/2005 Document Revised: 02/09/2013 Document Reviewed: 09/29/2011 ExitCare Patient Information 2015 [employer]. This information is not   intended to replace advice  given to you by your health care provider. Make sure you discuss any questions you have with your health care provider.  

## 2015-08-21 ENCOUNTER — Encounter: Payer: Self-pay | Admitting: Family Medicine

## 2015-08-22 ENCOUNTER — Telehealth: Payer: Self-pay

## 2015-08-22 NOTE — Telephone Encounter (Signed)
Spoke with pt, she has been on thyroid medication for several years. She has learned of a supplement or hormone in the lastest issue of [facility] and wants to know if this would help with her thyroid. She thinks it is spelled Preglone? Please advise.

## 2015-08-22 NOTE — Telephone Encounter (Signed)
Pt have questions she would like to ask Dr Michaelle CopasSmith's opinion on, states another Dr want to give her Lidocaine injections for arthritis and also have another question about it Please call [phone]

## 2015-08-26 NOTE — Telephone Encounter (Signed)
I am not aware of new supplement that has been recommended by [facility]. I am not aware of any supplement that is needed or warranted once patient needs a thyroid replacement medication.  I would not recommend the supplement.

## 2015-08-26 NOTE — Telephone Encounter (Signed)
Left message on machine to call back  

## 2015-08-31 NOTE — Telephone Encounter (Signed)
Dr. Katrinka BlazingSmith I found the article online. I will place in your box.

## 2015-12-09 DIAGNOSIS — H2513 Age-related nuclear cataract, bilateral: Secondary | ICD-10-CM | POA: Diagnosis not present

## 2015-12-28 ENCOUNTER — Encounter: Payer: Medicare Other | Admitting: Family Medicine

## 2015-12-30 ENCOUNTER — Encounter: Payer: PPO | Admitting: Family Medicine

## 2015-12-30 ENCOUNTER — Ambulatory Visit (INDEPENDENT_AMBULATORY_CARE_PROVIDER_SITE_OTHER): Payer: PPO | Admitting: Family Medicine

## 2015-12-30 VITALS — BP 110/66 | HR 83 | Temp 98.1°F | Resp 16 | Ht 62.0 in | Wt 104.0 lb

## 2015-12-30 DIAGNOSIS — Z23 Encounter for immunization: Secondary | ICD-10-CM

## 2015-12-30 DIAGNOSIS — R636 Underweight: Secondary | ICD-10-CM

## 2015-12-30 DIAGNOSIS — Z1159 Encounter for screening for other viral diseases: Secondary | ICD-10-CM

## 2015-12-30 DIAGNOSIS — M810 Age-related osteoporosis without current pathological fracture: Secondary | ICD-10-CM | POA: Insufficient documentation

## 2015-12-30 DIAGNOSIS — Z0000 Encounter for general adult medical examination without abnormal findings: Secondary | ICD-10-CM | POA: Diagnosis not present

## 2015-12-30 DIAGNOSIS — Z1211 Encounter for screening for malignant neoplasm of colon: Secondary | ICD-10-CM | POA: Diagnosis not present

## 2015-12-30 DIAGNOSIS — E034 Atrophy of thyroid (acquired): Secondary | ICD-10-CM

## 2015-12-30 DIAGNOSIS — E038 Other specified hypothyroidism: Secondary | ICD-10-CM | POA: Diagnosis not present

## 2015-12-30 LAB — CBC WITH DIFFERENTIAL/PLATELET
Basophils Absolute: 0.1 10*3/uL (ref 0.0–0.1)
Basophils Relative: 1 % (ref 0–1)
EOS ABS: 0.1 10*3/uL (ref 0.0–0.7)
Eosinophils Relative: 1 % (ref 0–5)
HCT: 40.4 % (ref 36.0–46.0)
HEMOGLOBIN: 13.9 g/dL (ref 12.0–15.0)
LYMPHS PCT: 31 % (ref 12–46)
Lymphs Abs: 2 10*3/uL (ref 0.7–4.0)
MCH: 31.8 pg (ref 26.0–34.0)
MCHC: 34.4 g/dL (ref 30.0–36.0)
MCV: 92.4 fL (ref 78.0–100.0)
MONOS PCT: 10 % (ref 3–12)
MPV: 10.1 fL (ref 8.6–12.4)
Monocytes Absolute: 0.7 10*3/uL (ref 0.1–1.0)
NEUTROS ABS: 3.7 10*3/uL (ref 1.7–7.7)
NEUTROS PCT: 57 % (ref 43–77)
Platelets: 162 10*3/uL (ref 150–400)
RBC: 4.37 MIL/uL (ref 3.87–5.11)
RDW: 13.9 % (ref 11.5–15.5)
WBC: 6.5 10*3/uL (ref 4.0–10.5)

## 2015-12-30 LAB — POCT URINALYSIS DIP (MANUAL ENTRY)
BILIRUBIN UA: NEGATIVE
BILIRUBIN UA: NEGATIVE
Glucose, UA: NEGATIVE
LEUKOCYTES UA: NEGATIVE
Nitrite, UA: NEGATIVE
PH UA: 6.5
Protein Ur, POC: NEGATIVE
Spec Grav, UA: 1.015
Urobilinogen, UA: 0.2

## 2015-12-30 LAB — COMPREHENSIVE METABOLIC PANEL
ALBUMIN: 4.1 g/dL (ref 3.6–5.1)
ALK PHOS: 65 U/L (ref 33–130)
ALT: 16 U/L (ref 6–29)
AST: 20 U/L (ref 10–35)
BUN: 11 mg/dL (ref 7–25)
CHLORIDE: 104 mmol/L (ref 98–110)
CO2: 27 mmol/L (ref 20–31)
Calcium: 9.1 mg/dL (ref 8.6–10.4)
Creat: 0.58 mg/dL (ref 0.50–0.99)
Glucose, Bld: 80 mg/dL (ref 65–99)
POTASSIUM: 4.1 mmol/L (ref 3.5–5.3)
Sodium: 141 mmol/L (ref 135–146)
TOTAL PROTEIN: 7.2 g/dL (ref 6.1–8.1)
Total Bilirubin: 0.9 mg/dL (ref 0.2–1.2)

## 2015-12-30 MED ORDER — ZOSTER VACCINE LIVE 19400 UNT/0.65ML ~~LOC~~ SOLR: 0.6500 mL | Freq: Once | SUBCUTANEOUS | Status: DC

## 2015-12-30 NOTE — Patient Instructions (Addendum)
Because you received labwork today, you will receive an invoice from [facility]. Please contact [facility] at [phone] with questions or concerns regarding your invoice. Our billing staff will not be able to assist you with those questions.  You will be contacted with the lab results as soon as they are available. The fastest way to get your results is to activate your My Chart account. Instructions are located on the last page of this paperwork. If you have not heard from us regarding the results in 2 weeks, please contact this office.  Keeping You Healthy  Get These Tests  Blood Pressure- Have your blood pressure checked by your healthcare provider at least once a year.  Normal blood pressure is 120/80.  Weight- Have your body mass index (BMI) calculated to screen for obesity.  BMI is a measure of body fat based on height and weight.  You can calculate your own BMI at www.nhlbisupport.com/bmi/  Cholesterol- Have your cholesterol checked every year.  Diabetes- Have your blood sugar checked every year if you have high blood pressure, high cholesterol, a family history of diabetes or if you are overweight.  Pap Test - Have a pap test every 1 to 5 years if you have been sexually active.  If you are older than 65 and recent pap tests have been normal you may not need additional pap tests.  In addition, if you have had a hysterectomy  for benign disease additional pap tests are not necessary.  Mammogram-Yearly mammograms are essential for early detection of breast cancer  Screening for Colon Cancer- Colonoscopy starting at age 50. Screening may begin sooner depending on your family history and other health conditions.  Follow up colonoscopy as directed by your Gastroenterologist.  Screening for Osteoporosis- Screening begins at age 65 with bone density scanning, sooner if you are at higher risk for developing Osteoporosis.  Get these medicines  Calcium with Vitamin  D- Your body requires 1200-1500 mg of Calcium a day and 800-1000 IU of Vitamin D a day.  You can only absorb 500 mg of Calcium at a time therefore Calcium must be taken in 2 or 3 separate doses throughout the day.  Hormones- Hormone therapy has been associated with increased risk for certain cancers and heart disease.  Talk to your healthcare provider about if you need relief from menopausal symptoms.  Aspirin- Ask your healthcare provider about taking Aspirin to prevent Heart Disease and Stroke.  Get these Immuniztions  Flu shot- Every fall  Pneumonia shot- Once after the age of 65; if you are younger ask your healthcare provider if you need a pneumonia shot.  Tetanus- Every ten years.  Zostavax- Once after the age of 60 to prevent shingles.  Take these steps  Don't smoke- Your healthcare provider can help you quit. For tips on how to quit, ask your healthcare provider or go to www.smokefree.gov or call 1-800 QUIT-NOW.  Be physically active- Exercise 5 days a week for a minimum of 30 minutes.  If you are not already physically active, start slow and gradually work up to 30 minutes of moderate physical activity.  Try walking, dancing, bike riding, swimming, etc.  Eat a healthy diet- Eat a variety of healthy foods such as fruits, vegetables, whole grains, low fat milk, low fat cheeses, yogurt, lean meats, chicken, fish, eggs, dried beans, tofu, etc.  For more information go to www.thenutritionsource.org  Dental visit- Brush and floss teeth twice daily; visit your dentist twice a year.    Eye exam- Visit your Optometrist or Ophthalmologist yearly.  Drink alcohol in moderation- Limit alcohol intake to one drink or less a day.  Never drink and drive.  Depression- Your emotional health is as important as your physical health.  If you're feeling down or losing interest in things you normally enjoy, please talk to your healthcare provider.  Seat Belts- can save your life; always wear  one  Smoke/Carbon Monoxide detectors- These detectors need to be installed on the appropriate level of your home.  Replace batteries at least once a year.  Violence- If anyone is threatening or hurting you, please tell your healthcare provider. Living Will/ Health care power of attorney- Discuss with your healthcare provider and family. 

## 2015-12-30 NOTE — Progress Notes (Signed)
Subjective:    Patient ID: Alyssa Grant, female    DOB: 19-Oct-1951, 65 y.o.   MRN: [id number]  12/30/2015  Annual Exam   HPI This 65 y.o. female presents for Annual Wellness Examination.  Last physical:  12-27-2014 Pap smear:  12-27-2014  WNL Mammogram:  01-27-2015 Colonoscopy:  2001; no polyps; refuses repeat; spastic colon. Bone density:  01-27-2015 TDAP:  2002 Pneumovax:  Prevnar 13  12-27-2014 Zostavax:  rx provided last visit. Mother had shingles.  Never had. Influenza:  07-08-2015 Eye exam:  +glasses; no g/early cataracts/no mac deg Dental exam:  Every year.  Hypothyroidism: Patient reports good compliance with medication, good tolerance to medication, and good symptom control.    Osteoporosis: bone density last year; refuses bisphosphonate or any other medication.  Review of Systems  Constitutional: Negative for fever, chills, diaphoresis, activity change, appetite change, fatigue and unexpected weight change.  HENT: Negative for congestion, dental problem, drooling, ear discharge, ear pain, facial swelling, hearing loss, mouth sores, nosebleeds, postnasal drip, rhinorrhea, sinus pressure, sneezing, sore throat, tinnitus, trouble swallowing and voice change.   Eyes: Negative for photophobia, pain, discharge, redness, itching and visual disturbance.  Respiratory: Negative for apnea, cough, choking, chest tightness, shortness of breath, wheezing and stridor.   Cardiovascular: Negative for chest pain, palpitations and leg swelling.  Gastrointestinal: Negative for nausea, vomiting, abdominal pain, diarrhea, constipation, blood in stool, abdominal distention, anal bleeding and rectal pain.  Endocrine: Negative for cold intolerance, heat intolerance, polydipsia, polyphagia and polyuria.  Genitourinary: Negative for dysuria, urgency, frequency, hematuria, flank pain, decreased urine volume, vaginal bleeding, vaginal discharge, enuresis, difficulty urinating, genital sores, vaginal  pain, menstrual problem, pelvic pain and dyspareunia.  Musculoskeletal: Negative for myalgias, back pain, joint swelling, arthralgias, gait problem, neck pain and neck stiffness.  Skin: Negative for color change, pallor, rash and wound.  Allergic/Immunologic: Negative for environmental allergies, food allergies and immunocompromised state.  Neurological: Negative for dizziness, tremors, seizures, syncope, facial asymmetry, speech difficulty, weakness, light-headedness, numbness and headaches.  Hematological: Negative for adenopathy. Does not bruise/bleed easily.  Psychiatric/Behavioral: Negative for suicidal ideas, hallucinations, behavioral problems, confusion, sleep disturbance, self-injury, dysphoric mood, decreased concentration and agitation. The patient is not nervous/anxious and is not hyperactive.        Bedtime 11:30-12:00; wakes up 6:15-6:30am.      Past Medical History  Diagnosis Date  . Allergy     Claritin; seasonal.  . Arthritis     Hands, hips, knees.  Ibuprofen PRN.  Marland Kitchen. Hypothyroidism     goiter; onset age 65.  . Osteoporosis    Past Surgical History  Procedure Laterality Date  . Breast surgery      breast cyst resection; benign.  x 2.   Allergies  Allergen Reactions  . Levaquin [Levofloxacin]   . Penicillins     Rash/swelling  . Sulfa Antibiotics     rash   Current Outpatient Prescriptions  Medication Sig Dispense Refill  . ferrous sulfate 325 (65 FE) MG tablet Take 325 mg by mouth daily with breakfast.    . levothyroxine (SYNTHROID, LEVOTHROID) 75 MCG tablet Take 1 tablet (75 mcg total) by mouth daily before breakfast. 90 tablet 3  . liothyronine (CYTOMEL) 25 MCG tablet Take 1 tablet (25 mcg total) by mouth daily. 90 tablet 3  . Multiple Vitamin (MULTIVITAMIN) tablet Take 1 tablet by mouth daily.    Marland Kitchen. OVER THE COUNTER MEDICATION OTC Fish Oil 320 mg EPA, 200 mg DHA taking daily    . OVER  THE COUNTER MEDICATION Magnesium Malate 1250 mg taking daily    . OVER  THE COUNTER MEDICATION Vitamin D 1000 iu taking daily    . OVER THE COUNTER MEDICATION Biotin 5000 mcg taking daily    . OVER THE COUNTER MEDICATION Aspirin 81 mg taking daily    . vitamin E 400 UNIT capsule Take 400 Units by mouth daily.    Marland Kitchen oseltamivir (TAMIFLU) 75 MG capsule Take 1 capsule (75 mg total) by mouth 2 (two) times daily. 10 capsule 0  . zoster vaccine live, PF, (ZOSTAVAX) 91478 UNT/0.65ML injection Inject 19,400 Units into the skin once. 1 each 0   No current facility-administered medications for this visit.   Social History   Social History  . Marital Status: Married    Spouse Name: Widowed  . Number of Children: 1  . Years of Education: N/A   Occupational History  . housecleaning     self employed; works 40 hours per week.   Social History Main Topics  . Smoking status: Never Smoker   . Smokeless tobacco: Never Used  . Alcohol Use: No  . Drug Use: No  . Sexual Activity:    Partners: Male   Other Topics Concern  . Not on file   Social History Narrative   Marital status:  Widowed; husband died suddenly (MS, pacemaker, atrial fibrillation on Coumadin; head trauma with bleed s/p emergency surgery with persistent bleeding).  2013.  Not dating; not interested.        Children:  1 daughter; 1 stepson; 2 stepgrandchildren.      Lives: alone; has dog and cat.      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        Tobacco:  never       Alcohol:  Rarely; holidays      Exercise:  Walking 4 miles per day.      Seatbelts:  100%      Guns:  None      ADLs:  Independent with all ADLs; drives/cooks/finances.  No assistant devices.        Advanced Directives:  Living Will; HCPOA is daughter/[name] [phone]); FULL CODE but no prolonged measures.              Family History  Problem Relation Age of Onset  . Heart disease Mother 96    AMI x 2  .  Hyperlipidemia Mother   . COPD Mother   . Stroke Mother 15  . Cancer Father     prostate cancer; lung cancer  . Heart disease Father   . Hyperlipidemia Father   . Hypertension Father   . Alcohol abuse Father   . Cancer Sister 20    bone cancer? s/p radiation and chemotherapy  . Hyperlipidemia Sister   . Hypertension Sister   . Diabetes Brother   . Hyperlipidemia Brother   . Hypertension Brother   . Stroke Brother   . Heart disease Brother   . Hyperlipidemia Brother   . Hypertension Brother   . Stroke Brother   . Diabetes Sister   . Hyperlipidemia Sister   . Hypertension Sister   . Anxiety disorder Sister        Objective:    BP 110/66 mmHg  Pulse 83  Temp(Src) 98.1 F (36.7 C) (Oral)  Resp 16  Ht  (1.575 m)  Wt 104 lb (47.174  kg)  BMI 19.02 kg/m2  SpO2 97% Physical Exam  Constitutional: She is oriented to person, place, and time. She appears well-developed and well-nourished. No distress.  HENT:  Head: Normocephalic and atraumatic.  Right Ear: External ear normal.  Left Ear: External ear normal.  Nose: Nose normal.  Mouth/Throat: Oropharynx is clear and moist.  Eyes: Conjunctivae and EOM are normal. Pupils are equal, round, and reactive to light.  Neck: Normal range of motion and full passive range of motion without pain. Neck supple. No JVD present. Carotid bruit is not present. No thyromegaly present.  Cardiovascular: Normal rate, regular rhythm and normal heart sounds.  Exam reveals no gallop and no friction rub.   No murmur heard. Pulmonary/Chest: Effort normal and breath sounds normal. She has no wheezes. She has no rales.  Abdominal: Soft. Bowel sounds are normal. She exhibits no distension and no mass. There is no tenderness. There is no rebound and no guarding.  Musculoskeletal:       Right shoulder: Normal.       Left shoulder: Normal.       Cervical back: Normal.  Lymphadenopathy:    She has no cervical adenopathy.  Neurological: She is alert  and oriented to person, place, and time. She has normal reflexes. No cranial nerve deficit. She exhibits normal muscle tone. Coordination normal.  Skin: Skin is warm and dry. No rash noted. She is not diaphoretic. No erythema. No pallor.  Psychiatric: She has a normal mood and affect. Her behavior is normal. Judgment and thought content normal.  Nursing note and vitals reviewed.  Depression screen [facility] 2/9 01/06/2016 12/30/2015 07/18/2015 12/27/2014 07/28/2014  Decreased Interest 0 0 0 0 0  Down, Depressed, Hopeless 0 0 0 0 0  PHQ - 2 Score 0 0 0 0 0   Fall Risk  12/27/2014  Falls in the past year? No        Assessment & Plan:   1. Encounter for Medicare annual wellness exam   2. Hypothyroidism due to acquired atrophy of thyroid   3. Underweight   4. Osteoporosis   5. Screening for colon cancer   6. Need for hepatitis C screening test   7. Need for prophylactic vaccination against Streptococcus pneumoniae (pneumococcus)   8. Need for Zostavax administration     Orders Placed This Encounter  Procedures  . Pneumococcal polysaccharide vaccine 23-valent greater than or equal to 2yo subcutaneous/IM  . CBC with Differential/Platelet  . Comprehensive metabolic panel  . TSH  . T4, free  . VITAMIN D 25 Hydroxy (Vit-D Deficiency, Fractures)  . Hepatitis C antibody  . POCT urinalysis dipstick  . POC Hemoccult Bld/Stl (3-Cd Home Screen)    Standing Status: Future     Number of Occurrences:      Standing Expiration Date: 12/29/2016   Meds ordered this encounter  Medications  . ferrous sulfate 325 (65 FE) MG tablet    Sig: Take 325 mg by mouth daily with breakfast.  . zoster vaccine live, PF, (ZOSTAVAX) 29562 UNT/0.65ML injection    Sig: Inject 19,400 Units into the skin once.    Dispense:  1 each    Refill:  0    Return in about 1 year (around 12/29/2016) for complete physical examiniation.    Kristi Paulita Fujita, M.D. [facility] [address] [place], [place]  [address] [phone] phone [phone] fax

## 2015-12-31 LAB — VITAMIN D 25 HYDROXY (VIT D DEFICIENCY, FRACTURES): VIT D 25 HYDROXY: 34 ng/mL (ref 30–100)

## 2015-12-31 LAB — HEPATITIS C ANTIBODY: HCV Ab: NEGATIVE

## 2015-12-31 LAB — TSH: TSH: 0.25 mIU/L — ABNORMAL LOW

## 2015-12-31 LAB — T4, FREE: FREE T4: 1 ng/dL (ref 0.8–1.8)

## 2016-01-03 ENCOUNTER — Telehealth: Payer: Self-pay

## 2016-01-03 NOTE — Telephone Encounter (Signed)
Tried to call pt. To see if she can come back and pick up a hemocult that Dr. Katrinka BlazingSmith wanted her to take home and do. It will be in the lab for pick up

## 2016-01-06 ENCOUNTER — Ambulatory Visit (INDEPENDENT_AMBULATORY_CARE_PROVIDER_SITE_OTHER): Payer: PPO | Admitting: Physician Assistant

## 2016-01-06 VITALS — BP 110/76 | HR 100 | Temp 98.9°F | Resp 16 | Ht 62.0 in | Wt 102.0 lb

## 2016-01-06 DIAGNOSIS — J111 Influenza due to unidentified influenza virus with other respiratory manifestations: Secondary | ICD-10-CM

## 2016-01-06 DIAGNOSIS — R69 Illness, unspecified: Principal | ICD-10-CM

## 2016-01-06 MED ORDER — OSELTAMIVIR PHOSPHATE 75 MG PO CAPS: 75.0000 mg | ORAL_CAPSULE | Freq: Two times a day (BID) | ORAL | Status: DC

## 2016-01-06 NOTE — Progress Notes (Signed)
   01/07/2016 7:24 PM   DOB: May 02, 1951 / MRN: [id number]  SUBJECTIVE:  Alyssa Grant is a 65 y.o. female presenting for nasal congestion, cough, myalgia, sore throat, HA, and fever that started this morning. She has tried claritin and ibuprofen with good relief of symptoms. She has no history of asthma, diabetes, and takes levothyroxine chronically.  [facility] reports the person sitting beside her was "coughing all over the place."  She has had the seasonal flu shot.    She is allergic to levaquin; penicillins; and sulfa antibiotics.   She  has a past medical history of Allergy; Arthritis; Hypothyroidism; and Osteoporosis.    She  reports that she has never smoked. She has never used smokeless tobacco. She reports that she does not drink alcohol or use illicit drugs. She  reports that she does not currently engage in sexual activity but has had female partners. The patient  has past surgical history that includes Breast surgery.  Her family history includes Alcohol abuse in her father; Anxiety disorder in her sister; COPD in her mother; Cancer in her father; Cancer (age of onset: 2020) in her sister; Diabetes in her brother and sister; Heart disease in her brother and father; Heart disease (age of onset: 4771) in her mother; Hyperlipidemia in her brother, brother, father, mother, sister, and sister; Hypertension in her brother, brother, father, sister, and sister; Stroke in her brother and brother; Stroke (age of onset: 3168) in her mother.  ROS  Problem list and medications reviewed and updated by myself where necessary, and exist elsewhere in the encounter.   OBJECTIVE:  BP 110/76 mmHg  Pulse 100  Temp(Src) 98.9 F (37.2 C) (Oral)  Resp 16  Ht 5\' 2"  (1.575 m)  Wt 102 lb (46.267 kg)  BMI 18.65 kg/m2  SpO2 96%  Physical Exam  Constitutional: She is oriented to person, place, and time. She appears well-nourished. No distress.  Eyes: EOM are normal. Pupils are equal,  round, and reactive to light.  Cardiovascular: Normal rate.   Pulmonary/Chest: Effort normal.  Abdominal: She exhibits no distension.  Neurological: She is alert and oriented to person, place, and time. No cranial nerve deficit. Gait normal.  Skin: Skin is dry. She is not diaphoretic.  Psychiatric: She has a normal mood and affect.  Vitals reviewed.   No results found for this or any previous visit (from the past 72 hour(s)).  No results found.  ASSESSMENT AND PLAN  Alyssa Grant was seen today for nasal congestion, sore throat, cough, headache and fever.  Diagnoses and all orders for this visit:  Influenza-like illness: Her symptoms are consistent with the flu.  She is elderly but otherwise healthy. Exam is negative.  Will start her on tamiflu and forgo testing.  -     oseltamivir (TAMIFLU) 75 MG capsule; Take 1 capsule (75 mg total) by mouth 2 (two) times daily.    The patient was advised to call or return to clinic if she does not see an improvement in symptoms or to seek the care of the closest emergency department if she worsens with the above plan.   Deliah BostonMichael Clark, MHS, PA-C [facility] 01/07/2016 7:24 PM

## 2016-01-06 NOTE — Patient Instructions (Signed)
     IF you received an x-ray today, you will receive an invoice from [facility]. Please contact [facility] at [phone] with questions or concerns regarding your invoice.   IF you received labwork today, you will receive an invoice from [facility]. Please contact [facility] at [phone] with questions or concerns regarding your invoice.   Our billing staff will not be able to assist you with questions regarding bills from these companies.  You will be contacted with the lab results as soon as they are available. The fastest way to get your results is to activate your My Chart account. Instructions are located on the last page of this paperwork. If you have not heard from us regarding the results in 2 weeks, please contact this office.      

## 2016-01-10 ENCOUNTER — Encounter: Payer: PPO | Admitting: Family Medicine

## 2016-01-14 ENCOUNTER — Encounter: Payer: Self-pay | Admitting: Family Medicine

## 2016-01-14 ENCOUNTER — Other Ambulatory Visit: Payer: Self-pay | Admitting: Family Medicine

## 2016-01-14 DIAGNOSIS — E034 Atrophy of thyroid (acquired): Secondary | ICD-10-CM

## 2016-08-08 ENCOUNTER — Telehealth: Payer: Self-pay | Admitting: Emergency Medicine

## 2016-11-26 ENCOUNTER — Telehealth: Payer: Self-pay

## 2016-11-26 MED ORDER — LEVOTHYROXINE SODIUM 75 MCG PO TABS: 75.0000 ug | ORAL_TABLET | Freq: Every day | ORAL | 0 refills | Status: DC

## 2016-11-26 MED ORDER — LIOTHYRONINE SODIUM 25 MCG PO TABS: 25.0000 ug | ORAL_TABLET | Freq: Every day | ORAL | 0 refills | Status: DC

## 2016-11-26 NOTE — Telephone Encounter (Signed)
Left message on pt's voicemail. Pt advised that her refills for her medications had been sent to the pharmacy so that she would not run out before her appointment in March. Pt advised to contact the office with any further questions.

## 2016-11-26 NOTE — Telephone Encounter (Signed)
Meds ordered this encounter  Medications  . levothyroxine (SYNTHROID, LEVOTHROID) 75 MCG tablet    Sig: Take 1 tablet (75 mcg total) by mouth daily before breakfast.    Dispense:  90 tablet    Refill:  0    Order Specific Question:   Supervising Provider    Answer:   SHAW, EVA N [[id number]]  . liothyronine (CYTOMEL) 25 MCG tablet    Sig: Take 1 tablet (25 mcg total) by mouth daily.    Dispense:  90 tablet    Refill:  0    Order Specific Question:   Supervising Provider    Answer:   Clelia CroftSHAW, EVA N [[id number]]   Please call this patient. Let her know that the refills have been sent in so that she doesn't run out prior to her appointment in March.

## 2016-11-26 NOTE — Telephone Encounter (Signed)
Pt is calling to schedule her cpe and would like to request a refill on her thyroid medications for levothyroxine and liothryonine she does not have enough to last her through to her cpe appt.  Please advise  [phone]

## 2017-01-02 ENCOUNTER — Encounter: Payer: Self-pay | Admitting: Family Medicine

## 2017-01-02 ENCOUNTER — Ambulatory Visit (INDEPENDENT_AMBULATORY_CARE_PROVIDER_SITE_OTHER): Payer: PPO | Admitting: Family Medicine

## 2017-01-02 VITALS — BP 112/74 | HR 96 | Temp 99.0°F | Resp 16 | Ht 62.0 in | Wt 106.0 lb

## 2017-01-02 DIAGNOSIS — M797 Fibromyalgia: Secondary | ICD-10-CM | POA: Diagnosis not present

## 2017-01-02 DIAGNOSIS — Z1231 Encounter for screening mammogram for malignant neoplasm of breast: Secondary | ICD-10-CM

## 2017-01-02 DIAGNOSIS — R636 Underweight: Secondary | ICD-10-CM

## 2017-01-02 DIAGNOSIS — Z0000 Encounter for general adult medical examination without abnormal findings: Secondary | ICD-10-CM | POA: Diagnosis not present

## 2017-01-02 DIAGNOSIS — E034 Atrophy of thyroid (acquired): Secondary | ICD-10-CM

## 2017-01-02 DIAGNOSIS — M810 Age-related osteoporosis without current pathological fracture: Secondary | ICD-10-CM | POA: Diagnosis not present

## 2017-01-02 DIAGNOSIS — E2839 Other primary ovarian failure: Secondary | ICD-10-CM | POA: Diagnosis not present

## 2017-01-02 DIAGNOSIS — Z1211 Encounter for screening for malignant neoplasm of colon: Secondary | ICD-10-CM

## 2017-01-02 DIAGNOSIS — Z23 Encounter for immunization: Secondary | ICD-10-CM

## 2017-01-02 LAB — POCT URINALYSIS DIP (MANUAL ENTRY)
BILIRUBIN UA: NEGATIVE
BILIRUBIN UA: NEGATIVE
GLUCOSE UA: NEGATIVE
Leukocytes, UA: NEGATIVE
Nitrite, UA: NEGATIVE
PH UA: 5
Protein Ur, POC: NEGATIVE
Spec Grav, UA: 1.01
Urobilinogen, UA: 0.2

## 2017-01-02 MED ORDER — LIOTHYRONINE SODIUM 25 MCG PO TABS: 25.0000 ug | ORAL_TABLET | Freq: Every day | ORAL | 3 refills | Status: DC

## 2017-01-02 MED ORDER — ZOSTER VAC RECOMB ADJUVANTED 50 MCG/0.5ML IM SUSR: 50.0000 ug | Freq: Once | INTRAMUSCULAR | 1 refills | Status: AC

## 2017-01-02 MED ORDER — LEVOTHYROXINE SODIUM 75 MCG PO TABS: 75.0000 ug | ORAL_TABLET | Freq: Every day | ORAL | 3 refills | Status: DC

## 2017-01-02 NOTE — Patient Instructions (Signed)

## 2017-01-02 NOTE — Progress Notes (Signed)
Subjective:    Patient ID: Alyssa Grant, female    DOB: [birth year], 66 y.o.   MRN: [id number]  01/02/2017  Annual Exam and Immunizations (pnuemonia shot)   HPI This 66 y.o. female presents for Annual Wellness Examination.  Last physical: 12-30-2015 Pap smear:   12-27-2014 WNL Mammogram:  12-2014 Colonoscopy:  2005 Bone density: 12-2014  Immunization History  Administered Date(s) Administered  . Influenza,inj,Quad PF,36+ Mos 07/28/2014, 07/18/2015  . Influenza-Unspecified 08/01/2016  . Pneumococcal Conjugate-13 12/27/2014  . Pneumococcal Polysaccharide-23 12/30/2015  . Tdap 10/29/2000   BP Readings from Last 3 Encounters:  01/02/17 112/74  01/06/16 110/76  12/30/15 110/66   Wt Readings from Last 3 Encounters:  01/02/17 106 lb (48.1 kg)  01/06/16 102 lb (46.3 kg)  12/30/15 104 lb (47.2 kg)   DID NOT RECEIVE SHINGLES VACCINE.  Hypothyroidism: Patient reports good compliance with medication, good tolerance to medication, and good symptom control.    Fibromyalgia: diagnosed by Deveschwar years ago; had a recent flare; called Dr. Nada Maclachlan office; needed referral again.   Osteoporosis: refuses medication.    Review of Systems  Constitutional: Negative for activity change, appetite change, chills, diaphoresis, fatigue, fever and unexpected weight change.  HENT: Negative for congestion, dental problem, drooling, ear discharge, ear pain, facial swelling, hearing loss, mouth sores, nosebleeds, postnasal drip, rhinorrhea, sinus pressure, sneezing, sore throat, tinnitus, trouble swallowing and voice change.   Eyes: Negative for photophobia, pain, discharge, redness, itching and visual disturbance.  Respiratory: Negative for apnea, cough, choking, chest tightness, shortness of breath, wheezing and stridor.   Cardiovascular: Negative for chest pain, palpitations and leg swelling.  Gastrointestinal: Negative for abdominal distention, abdominal pain, anal bleeding, blood in  stool, constipation, diarrhea, nausea, rectal pain and vomiting.  Endocrine: Negative for cold intolerance, heat intolerance, polydipsia, polyphagia and polyuria.  Genitourinary: Negative for decreased urine volume, difficulty urinating, dyspareunia, dysuria, enuresis, flank pain, frequency, genital sores, hematuria, menstrual problem, pelvic pain, urgency, vaginal bleeding, vaginal discharge and vaginal pain.  Musculoskeletal: Positive for arthralgias, back pain and myalgias. Negative for gait problem, joint swelling, neck pain and neck stiffness.  Skin: Negative for color change, pallor, rash and wound.  Allergic/Immunologic: Negative for environmental allergies, food allergies and immunocompromised state.  Neurological: Negative for dizziness, tremors, seizures, syncope, facial asymmetry, speech difficulty, weakness, light-headedness, numbness and headaches.  Hematological: Negative for adenopathy. Does not bruise/bleed easily.  Psychiatric/Behavioral: Negative for agitation, behavioral problems, confusion, decreased concentration, dysphoric mood, hallucinations, self-injury, sleep disturbance and suicidal ideas. The patient is not nervous/anxious and is not hyperactive.     Past Medical History:  Diagnosis Date  . Allergy    Claritin; seasonal.  . Arthritis    Hands, hips, knees.  Ibuprofen PRN.  . Fibromyalgia    dx by Deveschwar  . Hypothyroidism    goiter; onset age 39.  . Osteoporosis    Past Surgical History:  Procedure Laterality Date  . BREAST SURGERY     breast cyst resection; benign.  x 2.   Allergies  Allergen Reactions  . Levaquin [Levofloxacin]   . Penicillins     Rash/swelling  . Sulfa Antibiotics     rash    Social History   Social History  . Marital status: Married    Spouse name: Widowed  . Number of children: 1  . Years of education: N/A   Occupational History  . housecleaning     self employed; works 40 hours per week.   Social History Main  Topics  .  Smoking status: Never Smoker  . Smokeless tobacco: Never Used  . Alcohol use No  . Drug use: No  . Sexual activity: Not Currently    Partners: Male   Other Topics Concern  . Not on file   Social History Narrative   Marital status:  Widowed; husband died suddenly (MS, pacemaker, atrial fibrillation on Coumadin; head trauma with bleed s/p emergency surgery with persistent bleeding).  2013.  Not dating; not interested.        Children:  1 daughter; 1 stepson; 2 stepgrandchildren.  Daughter in [place]; [place] in [place].      Lives: alone; has dog and cat.      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        Tobacco:  never       Alcohol:  Rarely; holidays      Exercise:  Walking 2 miles per day.      Seatbelts:  100%      Guns:  None      ADLs:  Independent with all ADLs; drives/cooks/finances.  No assistant devices.        Advanced Directives:  Living Will; HCPOA is daughter/[name] [phone]); FULL CODE but no prolonged measures.              Family History  Problem Relation Age of Onset  . Heart disease Mother 6    AMI x 2  . Hyperlipidemia Mother   . COPD Mother   . Stroke Mother 33  . Cancer Father     prostate cancer; lung cancer  . Heart disease Father   . Hyperlipidemia Father   . Hypertension Father   . Alcohol abuse Father   . Cancer Sister 20    bone cancer? s/p radiation and chemotherapy  . Hyperlipidemia Sister   . Hypertension Sister   . Diabetes Brother   . Hyperlipidemia Brother   . Hypertension Brother   . Stroke Brother   . Heart disease Brother   . Hyperlipidemia Brother   . Hypertension Brother   . Stroke Brother   . Diabetes Sister   . Hyperlipidemia Sister   . Hypertension Sister   . Anxiety disorder Sister        Objective:    BP 112/74   Pulse 96   Temp 99 F (37.2 C) (Oral)   Resp 16   Ht 5\' 2"   (1.575 m)   Wt 106 lb (48.1 kg)   SpO2 96%   BMI 19.39 kg/m  Physical Exam  Constitutional: She is oriented to person, place, and time. She appears well-developed and well-nourished. No distress.  HENT:  Head: Normocephalic and atraumatic.  Right Ear: External ear normal.  Left Ear: External ear normal.  Nose: Nose normal.  Mouth/Throat: Oropharynx is clear and moist.  Eyes: Conjunctivae and EOM are normal. Pupils are equal, round, and reactive to light.  Neck: Normal range of motion and full passive range of motion without pain. Neck supple. No JVD present. Carotid bruit is not present. No thyromegaly present.  Cardiovascular: Normal rate, regular rhythm and normal heart sounds.  Exam reveals no gallop and no friction rub.   No murmur heard. Pulmonary/Chest: Effort normal and breath sounds normal. She has no wheezes. She has no rales. Right breast exhibits no inverted nipple, no mass, no nipple discharge, no skin change and no tenderness. Left breast exhibits no  inverted nipple, no mass, no nipple discharge, no skin change and no tenderness. Breasts are symmetrical.  Abdominal: Soft. Bowel sounds are normal. She exhibits no distension and no mass. There is no tenderness. There is no rebound and no guarding.  Musculoskeletal:       Right shoulder: Normal.       Left shoulder: Normal.       Cervical back: Normal.  Lymphadenopathy:    She has no cervical adenopathy.  Neurological: She is alert and oriented to person, place, and time. She has normal reflexes. No cranial nerve deficit. She exhibits normal muscle tone. Coordination normal.  Skin: Skin is warm and dry. No rash noted. She is not diaphoretic. No erythema. No pallor.  Psychiatric: She has a normal mood and affect. Her behavior is normal. Judgment and thought content normal.  Nursing note and vitals reviewed.  [facility] 01/02/2017 01/06/2016 12/30/2015 07/18/2015 12/27/2014  Decreased Interest 0 0 0 0 0  Down,  Depressed, Hopeless 0 0 0 0 0  PHQ - 2 Score 0 0 0 0 0   Fall Risk  01/02/2017 12/27/2014  Falls in the past year? No No   Functional Status Survey: Is the patient deaf or have difficulty hearing?: No Does the patient have difficulty seeing, even when wearing glasses/contacts?: Yes Does the patient have difficulty concentrating, remembering, or making decisions?: No Does the patient have difficulty walking or climbing stairs?: No Does the patient have difficulty dressing or bathing?: No Does the patient have difficulty doing errands alone such as visiting a doctor's office or shopping?: No      Assessment & Plan:   1. Medicare annual wellness visit, subsequent   2. Hypothyroidism due to acquired atrophy of thyroid   3. Age-related osteoporosis without current pathological fracture   4. Underweight   5. Need for shingles vaccine   6. Encounter for screening mammogram for breast cancer   7. Estrogen deficiency   8. Colon cancer screening   9. Fibromyalgia    -anticipatory guidance --- exercise, weight gain, 3 servings of dairy daily. -refer for bone density and mammogram. -hemoccult cards provided for colon cancer screening pt refuses colonoscopy. -obtain labs for chronic medical conditions; refills provided. -has advance directives; desires FULL CODE but no prolonged measures. -may warrant referral back to Dr. Link Snuffereveschwar for fibromyalgia.   Orders Placed This Encounter  Procedures  . MM SCREENING BREAST TOMO BILATERAL    Standing Status:   Future    Standing Expiration Date:   03/05/2018    Order Specific Question:   Reason for Exam (SYMPTOM  OR DIAGNOSIS REQUIRED)    Answer:   annual screening    Order Specific Question:   [facility]?    [facility]  . DG Bone Density    Standing Status:   Future    Standing Expiration Date:   03/04/2018    Order Specific Question:   Reason for Exam (SYMPTOM  OR DIAGNOSIS REQUIRED)    Answer:   estrogen  deficiency    Order Specific Question:   Preferred imaging location?    Answer[facility]  . CBC with Differential/Platelet  . Comprehensive metabolic panel  . TSH  . T4, free  . VITAMIN D 25 Hydroxy (Vit-D Deficiency, Fractures)  . POCT urinalysis dipstick  . IFOBT POC (occult bld, rslt in office)    Standing Status:   Future    Standing Expiration Date:   01/02/2018  Meds ordered this encounter  Medications  . Zoster Vac Recomb Adjuvanted (SHINGRIX) 50 MCG SUSR    Sig: Inject 50 mcg into the muscle once.    Dispense:  1 each    Refill:  1  . levothyroxine (SYNTHROID, LEVOTHROID) 75 MCG tablet    Sig: Take 1 tablet (75 mcg total) by mouth daily before breakfast.    Dispense:  90 tablet    Refill:  3  . liothyronine (CYTOMEL) 25 MCG tablet    Sig: Take 1 tablet (25 mcg total) by mouth daily.    Dispense:  90 tablet    Refill:  3    No Follow-up on file.   Lenix Benoist Paulita Fujita, M.D. Primary Care at [facility] previously [facility] [address] [place], [place]  [address] [phone] phone [phone] fax

## 2017-01-03 LAB — CBC WITH DIFFERENTIAL/PLATELET
BASOS ABS: 0.1 10*3/uL (ref 0.0–0.2)
Basos: 1 %
EOS (ABSOLUTE): 0.1 10*3/uL (ref 0.0–0.4)
Eos: 1 %
HEMOGLOBIN: 13.6 g/dL (ref 11.1–15.9)
Hematocrit: 40.4 % (ref 34.0–46.6)
IMMATURE GRANS (ABS): 0 10*3/uL (ref 0.0–0.1)
Immature Granulocytes: 0 %
LYMPHS: 30 %
Lymphocytes Absolute: 1.9 10*3/uL (ref 0.7–3.1)
MCH: 30.5 pg (ref 26.6–33.0)
MCHC: 33.7 g/dL (ref 31.5–35.7)
MCV: 91 fL (ref 79–97)
MONOCYTES: 7 %
Monocytes Absolute: 0.4 10*3/uL (ref 0.1–0.9)
NEUTROS PCT: 61 %
Neutrophils Absolute: 3.8 10*3/uL (ref 1.4–7.0)
PLATELETS: 196 10*3/uL (ref 150–379)
RBC: 4.46 x10E6/uL (ref 3.77–5.28)
RDW: 13.5 % (ref 12.3–15.4)
WBC: 6.2 10*3/uL (ref 3.4–10.8)

## 2017-01-03 LAB — COMPREHENSIVE METABOLIC PANEL
ALBUMIN: 4.1 g/dL (ref 3.6–4.8)
ALK PHOS: 73 IU/L (ref 39–117)
ALT: 12 IU/L (ref 0–32)
AST: 19 IU/L (ref 0–40)
Albumin/Globulin Ratio: 1.5 (ref 1.2–2.2)
BUN / CREAT RATIO: 23 (ref 12–28)
BUN: 15 mg/dL (ref 8–27)
Bilirubin Total: 0.6 mg/dL (ref 0.0–1.2)
CHLORIDE: 102 mmol/L (ref 96–106)
CO2: 26 mmol/L (ref 18–29)
CREATININE: 0.65 mg/dL (ref 0.57–1.00)
Calcium: 9.2 mg/dL (ref 8.7–10.3)
GFR calc non Af Amer: 92 mL/min/{1.73_m2} (ref >59)
GFR, EST AFRICAN AMERICAN: 106 mL/min/{1.73_m2} (ref >59)
GLUCOSE: 90 mg/dL (ref 65–99)
Globulin, Total: 2.8 g/dL (ref 1.5–4.5)
Potassium: 4.5 mmol/L (ref 3.5–5.2)
Sodium: 141 mmol/L (ref 134–144)
TOTAL PROTEIN: 6.9 g/dL (ref 6.0–8.5)

## 2017-01-03 LAB — TSH: TSH: 0.993 u[IU]/mL (ref 0.450–4.500)

## 2017-01-03 LAB — VITAMIN D 25 HYDROXY (VIT D DEFICIENCY, FRACTURES): VIT D 25 HYDROXY: 29.6 ng/mL — AB (ref 30.0–100.0)

## 2017-01-03 LAB — T4, FREE: Free T4: 0.9 ng/dL (ref 0.82–1.77)

## 2017-03-26 DIAGNOSIS — J019 Acute sinusitis, unspecified: Secondary | ICD-10-CM | POA: Diagnosis not present

## 2017-03-26 DIAGNOSIS — R062 Wheezing: Secondary | ICD-10-CM | POA: Diagnosis not present

## 2017-07-08 NOTE — Progress Notes (Signed)
Subjective:    Patient ID: Alyssa Grant, female    DOB: 01-23-1951, 66 y.o.   MRN: [id number]  07/09/2017  Insect Bite (pt states she was bite on three different days by different ticks ) and Fibromyalgia (flare up for about 5 weeks more muscler )   HPI This 66 y.o. female presents for evaluation of tick bites.  Found tick on 06/30/17 longestar tick; then 07/07/17 found a regular tick. The following 9/14 found tiny tick.  Had yard sprayed. No more tick bites since then.  The started having problems for past five weeks.   Has fibromyalgia and will have severe flares.  Thought having fibro flare yet did not resolve like normally.  Still having five weeks of muscle pain in neck upper shoulders, thoracic region, B hips and thighs; also having weakness in arms and legs for two or three weeks.  Decreased endurance; struggling to lay mulch like normally.  Cannot lift like normally.  No fever/chills/sweats.  +headache started intermittently for tt 2-3 weeks in occipital region and radiating into frontal region.   No joint swelling but having joint pain.  Knees have especially been hurting.  Always takes stairs at church and knees were really painful.  Worried about tick bites and fibro not settling down.    Had another sinus infection; Dr. Lazarus Salines was on vacation.  Went to a new walk in clinic near home; ran by home; diagnosed with sinusitis; treated with Doxycycline in 02/2017.   BP Readings from Last 3 Encounters:  07/09/17 103/68  01/02/17 112/74  01/06/16 110/76   Wt Readings from Last 3 Encounters:  07/09/17 105 lb (47.6 kg)  01/02/17 106 lb (48.1 kg)  01/06/16 102 lb (46.3 kg)   Immunization History  Administered Date(s) Administered  . Influenza,inj,Quad PF,6+ Mos 07/28/2014, 07/18/2015  . Influenza-Unspecified 08/01/2016  . Pneumococcal Conjugate-13 12/27/2014  . Pneumococcal Polysaccharide-23 12/30/2015  . Tdap 10/29/2000    Review of Systems  Constitutional: Positive for  fatigue. Negative for chills, diaphoresis and fever.  HENT: Negative for ear pain, postnasal drip, rhinorrhea, sinus pressure, sore throat and trouble swallowing.   Respiratory: Negative for cough and shortness of breath.   Cardiovascular: Negative for chest pain, palpitations and leg swelling.  Gastrointestinal: Negative for abdominal pain, constipation, diarrhea, nausea and vomiting.  Musculoskeletal: Positive for arthralgias, back pain, myalgias, neck pain and neck stiffness. Negative for gait problem and joint swelling.  Skin: Positive for wound. Negative for color change, pallor and rash.  Neurological: Negative for dizziness, weakness and numbness.  Psychiatric/Behavioral: Negative for dysphoric mood and sleep disturbance. The patient is not nervous/anxious.     Past Medical History:  Diagnosis Date  . Allergy    Claritin; seasonal.  . Arthritis    Hands, hips, knees.  Ibuprofen PRN.  . Fibromyalgia    dx by Deveschwar  . Hypothyroidism    goiter; onset age 51.  . Osteoporosis    Past Surgical History:  Procedure Laterality Date  . BREAST SURGERY     breast cyst resection; benign.  x 2.   Allergies  Allergen Reactions  . Levaquin [Levofloxacin]   . Penicillins     Rash/swelling  . Sulfa Antibiotics     rash    Social History   Social History  . Marital status: Married    Spouse name: Widowed  . Number of children: 1  . Years of education: N/A   Occupational History  . housecleaning     self employed;  works 40 hours per week.   Social History Main Topics  . Smoking status: Never Smoker  . Smokeless tobacco: Never Used  . Alcohol use No  . Drug use: No  . Sexual activity: Not Currently    Partners: Male   Other Topics Concern  . Not on file   Social History Narrative   Marital status:  Widowed; husband died suddenly (MS, pacemaker, atrial fibrillation on Coumadin; head trauma with bleed s/p emergency surgery with persistent bleeding).  2013.  Not  dating; not interested.        Children:  1 daughter; 1 stepson; 2 stepgrandchildren.  [place] in [place]; [place] in [place].      Lives: alone; has dog and cat.      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        Tobacco:  never       Alcohol:  Rarely; holidays      Exercise:  Walking 2 miles per day.      Seatbelts:  100%      Guns:  None      ADLs:  Independent with all ADLs; drives/cooks/finances.  No assistant devices.        Advanced Directives:  Living Will; HCPOA is daughter/Amber Marlou Starksebekah [place] [phone]); FULL CODE but no prolonged measures.              Family History  Problem Relation Age of Onset  . Heart disease Mother 3171       AMI x 2  . Hyperlipidemia Mother   . COPD Mother   . Stroke Mother 9068  . Cancer Father        prostate cancer; lung cancer  . Heart disease Father   . Hyperlipidemia Father   . Hypertension Father   . Alcohol abuse Father   . Cancer Sister 20       bone cancer? s/p radiation and chemotherapy  . Hyperlipidemia Sister   . Hypertension Sister   . Diabetes Brother   . Hyperlipidemia Brother   . Hypertension Brother   . Stroke Brother   . Heart disease Brother   . Hyperlipidemia Brother   . Hypertension Brother   . Stroke Brother   . Diabetes Sister   . Hyperlipidemia Sister   . Hypertension Sister   . Anxiety disorder Sister        Objective:    BP 103/68   Pulse 83   Temp 98 F (36.7 C) (Oral)   Resp 16   Ht 5' 2.6" (1.59 m)   Wt 105 lb (47.6 kg)   SpO2 96%   BMI 18.84 kg/m  Physical Exam  Constitutional: She is oriented to person, place, and time. She appears well-developed and well-nourished. No distress.  HENT:  Head: Normocephalic and atraumatic.  Right Ear: External ear normal.  Left Ear: External ear normal.  Nose: Nose normal.  Mouth/Throat: Oropharynx is clear and moist.  Eyes: Pupils are equal,  round, and reactive to light. Conjunctivae and EOM are normal.  Neck: Normal range of motion. Neck supple. Carotid bruit is not present. No thyromegaly present.  Cardiovascular: Normal rate, regular rhythm, normal heart sounds and intact distal pulses.  Exam reveals no gallop and no friction rub.   No murmur heard. Pulmonary/Chest: Effort normal and breath sounds normal. She has no wheezes. She has no rales.  Abdominal: Soft. Bowel sounds are  normal. She exhibits no distension and no mass. There is no tenderness. There is no rebound and no guarding.  Musculoskeletal:       Right shoulder: Normal.       Left shoulder: Normal.       Right wrist: Normal.       Left wrist: Normal.       Cervical back: She exhibits pain and spasm. She exhibits no tenderness and no bony tenderness.       Lumbar back: Normal. She exhibits normal range of motion.       Right hand: Normal.       Left hand: Normal.  Lymphadenopathy:    She has no cervical adenopathy.  Neurological: She is alert and oriented to person, place, and time. No cranial nerve deficit. She exhibits normal muscle tone. Coordination normal.  Skin: Skin is warm and dry. No rash noted. She is not diaphoretic. No erythema. No pallor.  Psychiatric: She has a normal mood and affect. Her behavior is normal.  Nursing note and vitals reviewed.   No results found. Depression screen [facility] 2/9 07/09/2017 01/02/2017 01/06/2016 12/30/2015 07/18/2015  Decreased Interest 0 0 0 0 0  Down, Depressed, Hopeless 0 0 0 0 0  PHQ - 2 Score 0 0 0 0 0   Fall Risk  07/09/2017 01/02/2017 12/27/2014  Falls in the past year? No No No        Assessment & Plan:   1. Tick bite, initial encounter   2. Fibromyalgia   3. Neck pain   4. Myalgia    -new onset tick bites this summer with ongoing myalgias and fatigue; treat with Doxy; send serology. -treat supportively with rest, stretches, yoga, Tylenol and/or NSAID as tolerated. -RTC for acute worsening. -will also obtain  autoimmune labs with current symptoms.  Orders Placed This Encounter  Procedures  . CBC with Differential/Platelet  . Comprehensive metabolic panel  . Rickettsial Fever Group IgG/M  . B. burgdorfi antibodies  . CK  . Sedimentation rate  . ANA w/Reflex if Positive  . Rheumatoid Arthritis Profile   Meds ordered this encounter  Medications  . doxycycline (VIBRAMYCIN) 100 MG capsule    Sig: Take 1 capsule (100 mg total) by mouth 2 (two) times daily.    Dispense:  30 capsule    Refill:  0    No Follow-up on file.   Mary Hockey Paulita Fujita, M.D. Primary Care at [facility] previously [facility] [address] [place], [place]  [address] [phone] phone [phone] fax

## 2017-07-09 ENCOUNTER — Encounter: Payer: Self-pay | Admitting: Family Medicine

## 2017-07-09 ENCOUNTER — Ambulatory Visit (INDEPENDENT_AMBULATORY_CARE_PROVIDER_SITE_OTHER): Payer: PPO | Admitting: Family Medicine

## 2017-07-09 VITALS — BP 103/68 | HR 83 | Temp 98.0°F | Resp 16 | Ht 62.6 in | Wt 105.0 lb

## 2017-07-09 DIAGNOSIS — W57XXXA Bitten or stung by nonvenomous insect and other nonvenomous arthropods, initial encounter: Secondary | ICD-10-CM

## 2017-07-09 DIAGNOSIS — M791 Myalgia, unspecified site: Secondary | ICD-10-CM

## 2017-07-09 DIAGNOSIS — M797 Fibromyalgia: Secondary | ICD-10-CM

## 2017-07-09 DIAGNOSIS — M542 Cervicalgia: Secondary | ICD-10-CM

## 2017-07-09 MED ORDER — DOXYCYCLINE HYCLATE 100 MG PO CAPS: 100.0000 mg | ORAL_CAPSULE | Freq: Two times a day (BID) | ORAL | 0 refills | Status: DC

## 2017-07-09 NOTE — Patient Instructions (Addendum)
   IF you received an x-ray today, you will receive an invoice from [facility]. Please contact [facility] at [phone] with questions or concerns regarding your invoice.   IF you received labwork today, you will receive an invoice from [facility]. Please contact [facility] at [phone] with questions or concerns regarding your invoice.   Our billing staff will not be able to assist you with questions regarding bills from these companies.  You will be contacted with the lab results as soon as they are available. The fastest way to get your results is to activate your My Chart account. Instructions are located on the last page of this paperwork. If you have not heard from us regarding the results in 2 weeks, please contact this office.     Tick Bite Information, Adult Ticks are insects that draw blood for food. Most ticks live in shrubs and grassy areas. They climb onto people and animals that brush against the leaves and grasses that they rest on. Then they bite, attaching themselves to the skin. Most ticks are harmless, but some ticks carry germs that can spread to a person through a bite and cause a disease. To reduce your risk of getting a disease from a tick bite, it is important to take steps to prevent tick bites. It is also important to check for ticks after being outdoors. If you find that a tick has attached to you, watch for symptoms of disease. How can I prevent tick bites? Take these steps to help prevent tick bites when you are outdoors in an area where ticks are found:  Use insect repellent that has DEET (20% or higher), picaridin, or IR3535 in it. Use it on: ? Skin that is showing. ? The top of your boots. ? Your pant legs. ? Your sleeve cuffs.  For repellent products that contain permethrin, follow product instructions. Use these products on: ? Clothing. ? Gear. ? Boots. ? Tents.  Wear protective clothing. Long sleeves and long pants offer the  best protection from ticks.  Wear light-colored clothing so you can see ticks more easily.  Tuck your pant legs into your socks.  If you go walking on a trail, stay in the middle of the trail so your skin, hair, and clothing do not touch the bushes.  Avoid walking through areas with long grass.  Check for ticks on your clothing, hair, and skin often while you are outside, and check again before you go inside. Make sure to check the places that ticks attach themselves most often. These places include the scalp, neck, armpits, waist, groin, and joint areas. Ticks that carry a disease called Lyme disease have to be attached to the skin for 24-48 hours. Checking for ticks every day will lessen your risk of this and other diseases.  When you come indoors, wash your clothes and take a shower or a bath right away. Dry your clothes in a dryer on high heat for at least 60 minutes. This will kill any ticks in your clothes.  What is the proper way to remove a tick? If you find a tick on your body, remove it as soon as possible. Removing a tick sooner rather than later can prevent germs from passing from the tick to your body. To remove a tick that is crawling on your skin but has not bitten:  Go outdoors and brush the tick off.  Remove the tick with tape or a lint roller.  To remove a tick that   is attached to your skin:  Wash your hands.  If you have latex gloves, put them on.  Use tweezers, curved forceps, or a tick-removal tool to gently grasp the tick as close to your skin and the tick's head as possible.  Gently pull with steady, upward pressure until the tick lets go. When removing the tick: ? Take care to keep the tick's head attached to its body. ? Do not twist or jerk the tick. This can make the tick's head or mouth break off. ? Do not squeeze or crush the tick's body. This could force disease-carrying fluids from the tick into your body.  Do not try to remove a tick with heat,  alcohol, petroleum jelly, or fingernail polish. Using these methods can cause the tick to salivate and regurgitate into your bloodstream, increasing your risk of getting a disease. What should I do after removing a tick?  Clean the bite area with soap and water, rubbing alcohol, or an iodine scrub.  If an antiseptic cream or ointment is available, apply a small amount to the bite site.  Wash and disinfect any instruments that you used to remove the tick. How should I dispose of a tick? To dispose of a live tick, use one of these methods:  Place it in rubbing alcohol.  Place it in a sealed bag or container.  Wrap it tightly in tape.  Flush it down the toilet.  Contact a health care provider if:  You have symptoms of a disease after a tick bite. Symptoms of a tick-borne disease can occur from moments after the tick bites to up to 30 days after a tick is removed. Symptoms include: ? Muscle, joint, or bone pain. ? Difficulty walking or moving your legs. ? Numbness in the legs. ? Paralysis. ? Red rash around the tick bite area that is shaped like a target or a "bull's-eye." ? Redness and swelling in the area of the tick bite. ? Fever. ? Repeated vomiting. ? Diarrhea. ? Weight loss. ? Tender, swollen lymph glands. ? Shortness of breath. ? Cough. ? Pain in the abdomen. ? Headache. ? Abnormal tiredness. ? A change in your level of consciousness. ? Confusion. Get help right away if:  You are not able to remove a tick.  A part of a tick breaks off and gets stuck in your skin.  Your symptoms get worse. Summary  Ticks may carry germs that can spread to a person through a bite and cause disease.  Wear protective clothing and use insect repellent to prevent tick bites. Follow product instructions.  If you find a tick on your body, remove it as soon as possible. If the tick is attached, do not try to remove with heat, alcohol, petroleum jelly, or fingernail polish.  Remove the  attached tick using tweezers, curved forceps, or a tick-removal tool. Gently pull with steady, upward pressure until the tick lets go. Do not twist or jerk the tick. Do not squeeze or crush the tick's body.  If you have symptoms after being bitten by a tick, contact a health care provider. This information is not intended to replace advice given to you by your health care provider. Make sure you discuss any questions you have with your health care provider. Document Released: 10/12/2000 Document Revised: 07/27/2016 Document Reviewed: 07/27/2016 Elsevier Interactive Patient Education  2018 Elsevier Inc.  

## 2017-07-11 LAB — SEDIMENTATION RATE: Sed Rate: 14 mm/hr (ref 0–40)

## 2017-07-11 LAB — CBC WITH DIFFERENTIAL/PLATELET
BASOS: 1 %
Basophils Absolute: 0 10*3/uL (ref 0.0–0.2)
EOS (ABSOLUTE): 0.1 10*3/uL (ref 0.0–0.4)
Eos: 1 %
HEMATOCRIT: 42.7 % (ref 34.0–46.6)
HEMOGLOBIN: 13.9 g/dL (ref 11.1–15.9)
IMMATURE GRANS (ABS): 0 10*3/uL (ref 0.0–0.1)
Immature Granulocytes: 0 %
LYMPHS ABS: 2.1 10*3/uL (ref 0.7–3.1)
LYMPHS: 28 %
MCH: 30.9 pg (ref 26.6–33.0)
MCHC: 32.6 g/dL (ref 31.5–35.7)
MCV: 95 fL (ref 79–97)
MONOCYTES: 8 %
Monocytes Absolute: 0.6 10*3/uL (ref 0.1–0.9)
NEUTROS ABS: 4.5 10*3/uL (ref 1.4–7.0)
Neutrophils: 62 %
Platelets: 213 10*3/uL (ref 150–379)
RBC: 4.5 x10E6/uL (ref 3.77–5.28)
RDW: 13.8 % (ref 12.3–15.4)
WBC: 7.3 10*3/uL (ref 3.4–10.8)

## 2017-07-11 LAB — COMPREHENSIVE METABOLIC PANEL
ALBUMIN: 4.4 g/dL (ref 3.6–4.8)
ALK PHOS: 71 IU/L (ref 39–117)
ALT: 15 IU/L (ref 0–32)
AST: 22 IU/L (ref 0–40)
Albumin/Globulin Ratio: 1.5 (ref 1.2–2.2)
BILIRUBIN TOTAL: 0.7 mg/dL (ref 0.0–1.2)
BUN / CREAT RATIO: 12 (ref 12–28)
BUN: 9 mg/dL (ref 8–27)
CHLORIDE: 99 mmol/L (ref 96–106)
CO2: 24 mmol/L (ref 20–29)
Calcium: 9.7 mg/dL (ref 8.7–10.3)
Creatinine, Ser: 0.73 mg/dL (ref 0.57–1.00)
GFR calc Af Amer: 99 mL/min/{1.73_m2} (ref >59)
GFR calc non Af Amer: 85 mL/min/{1.73_m2} (ref >59)
GLUCOSE: 93 mg/dL (ref 65–99)
Globulin, Total: 2.9 g/dL (ref 1.5–4.5)
Potassium: 4.8 mmol/L (ref 3.5–5.2)
Sodium: 139 mmol/L (ref 134–144)
Total Protein: 7.3 g/dL (ref 6.0–8.5)

## 2017-07-11 LAB — RHEUMATOID ARTHRITIS PROFILE: Cyclic Citrullin Peptide Ab: 13 units (ref 0–19)

## 2017-07-11 LAB — ANA W/REFLEX IF POSITIVE: ANA: NEGATIVE

## 2017-07-11 LAB — B. BURGDORFI ANTIBODIES: Lyme IgG/IgM Ab: <0.91 {ISR} (ref 0.00–0.90)

## 2017-07-11 LAB — RICKETTSIAL FEVER GROUP IGG/M
Spotted Fever Group IgM: <1:64 {titer}
Typhus Fever Group IgM: <1:64 {titer}

## 2017-07-11 LAB — CK: Total CK: 86 U/L (ref 24–173)

## 2017-07-12 ENCOUNTER — Encounter: Payer: Self-pay | Admitting: Family Medicine

## 2017-07-13 ENCOUNTER — Encounter: Payer: Self-pay | Admitting: Family Medicine

## 2017-11-14 ENCOUNTER — Encounter: Payer: PPO | Admitting: Physician Assistant

## 2017-11-14 ENCOUNTER — Encounter: Payer: Self-pay | Admitting: Physician Assistant

## 2017-11-14 NOTE — Patient Instructions (Signed)
     IF you received an x-ray today, you will receive an invoice from [facility]. Please contact [facility] at [phone] with questions or concerns regarding your invoice.   IF you received labwork today, you will receive an invoice from [facility]. Please contact [facility] at [phone] with questions or concerns regarding your invoice.   Our billing staff will not be able to assist you with questions regarding bills from these companies.  You will be contacted with the lab results as soon as they are available. The fastest way to get your results is to activate your My Chart account. Instructions are located on the last page of this paperwork. If you have not heard from us regarding the results in 2 weeks, please contact this office.     

## 2017-11-14 NOTE — Progress Notes (Signed)
Patient not seen today

## 2018-01-06 ENCOUNTER — Telehealth: Payer: Self-pay | Admitting: Family Medicine

## 2018-01-06 NOTE — Telephone Encounter (Signed)
Copied from CRM [id number]. Topic: General - Other >> Jan 06, 2018  8:19 AM Cecelia ByarsGreen, Temeka L, RMA wrote: Reason for CRM: Patient is requesting to reschedule her AWV appt on 01/14/18 @ 1:20 pm she has  to work at that time, please call pt to reschedule at [phone], patient states she can do 3/18 or 3/19 before 10 am

## 2018-01-07 NOTE — Telephone Encounter (Signed)
Copied from CRM [id number]. Topic: General - Other >> Jan 06, 2018  8:19 AM Cecelia ByarsGreen, Temeka L, RMA wrote: Reason for CRM: Patient is requesting to reschedule her AWV appt on 01/14/18 @ 1:20 pm she has  to work at that time, please call pt to reschedule at [phone], patient states she can do 3/18 or 3/19 before 10 am  >> Jan 07, 2018  9:06 AM Caryn SectionMorris, Sharamare E, NT wrote: Patient called to reschedule her AWV but the times that are available patient can't come in. Patient wanted to know if this appointment is needed before her cpe on 3/20. Pt would like a call back.  >> Jan 07, 2018  3:43 PM Raquel SarnaHayes, Teresa G wrote: Pt called back again regarding her AWV.   She needs a call back asap to help her with scheduling for her work schedule. Please call pt back ASAP to schedule.

## 2018-01-08 ENCOUNTER — Telehealth: Payer: Self-pay

## 2018-01-08 NOTE — Telephone Encounter (Signed)
Left pt a message with new appointment time.   Sherle PoeNicole Conn Trombetta, B.A.  Care Guide - [facility] [phone]   Copied from CRM [id number]. Topic: General - Other >> Jan 06, 2018  8:19 AM Cecelia ByarsGreen, Temeka L, RMA wrote: Reason for CRM: Patient is requesting to reschedule her AWV appt on 01/14/18 @ 1:20 pm she has  to work at that time, please call pt to reschedule at [phone], patient states she can do 3/18 or 3/19 before 10 am  >> Jan 07, 2018  9:06 AM Caryn SectionMorris, Sharamare E, NT wrote: Patient called to reschedule her AWV but the times that are available patient can't come in. Patient wanted to know if this appointment is needed before her cpe on 3/20. Pt would like a call back.  >> Jan 07, 2018  3:43 PM Raquel SarnaHayes, Teresa G wrote: Pt called back again regarding her AWV.   She needs a call back asap to help her with scheduling for her work schedule. Please call pt back ASAP to schedule.

## 2018-01-14 ENCOUNTER — Ambulatory Visit: Payer: PPO

## 2018-01-14 ENCOUNTER — Ambulatory Visit (INDEPENDENT_AMBULATORY_CARE_PROVIDER_SITE_OTHER): Payer: PPO

## 2018-01-14 VITALS — BP 110/78 | HR 83 | Ht 63.0 in | Wt 101.4 lb

## 2018-01-14 DIAGNOSIS — Z1231 Encounter for screening mammogram for malignant neoplasm of breast: Secondary | ICD-10-CM

## 2018-01-14 DIAGNOSIS — E039 Hypothyroidism, unspecified: Secondary | ICD-10-CM | POA: Diagnosis not present

## 2018-01-14 DIAGNOSIS — Z0000 Encounter for general adult medical examination without abnormal findings: Secondary | ICD-10-CM | POA: Diagnosis not present

## 2018-01-14 DIAGNOSIS — Z13220 Encounter for screening for lipoid disorders: Secondary | ICD-10-CM | POA: Diagnosis not present

## 2018-01-14 DIAGNOSIS — Z130 Encounter for screening for diseases of the blood and blood-forming organs and certain disorders involving the immune mechanism: Secondary | ICD-10-CM | POA: Diagnosis not present

## 2018-01-14 DIAGNOSIS — Z131 Encounter for screening for diabetes mellitus: Secondary | ICD-10-CM

## 2018-01-14 DIAGNOSIS — E2839 Other primary ovarian failure: Secondary | ICD-10-CM

## 2018-01-14 NOTE — Progress Notes (Signed)
Subjective:   Alyssa Grant is a 67 y.o. female who presents for Medicare Annual (Subsequent) preventive examination.  Review of Systems:  N/A Cardiac Risk Factors include: advanced age (>11men, >74 women)     Objective:     Vitals: BP 110/78   Pulse 83   Ht 5\' 3"  (1.6 m)   Wt 101 lb 6 oz (46 kg)   SpO2 97%   BMI 17.96 kg/m   Body mass index is 17.96 kg/m.  Advanced Directives 01/14/2018 12/30/2015 12/27/2014  Does Patient Have a Medical Advance Directive? Yes Yes Yes  Type of Estate agent of [place];Living will Healthcare Power of [place];Living will Healthcare Power of [place];Living will;Out of facility DNR (pink MOST or yellow form)  Does patient want to make changes to medical advance directive? - - No - Patient declined  Copy of Healthcare Power of Attorney in Chart? No - copy requested No - copy requested No - copy requested    Tobacco Social History   Tobacco Use  Smoking Status Never Smoker  Smokeless Tobacco Never Used     Counseling given: Not Answered   Clinical Intake:  Pre-visit preparation completed: Yes  Pain : No/denies pain     Nutritional Status: BMI <19  Underweight Nutritional Risks: None Diabetes: No  How often do you need to have someone help you when you read instructions, pamphlets, or other written materials from your doctor or pharmacy?: 1 - Never What is the last grade level you completed in school?: Associate degree  Interpreter Needed?: No  Information entered by :: Alyssa Grant  Past Medical History:  Diagnosis Date  . Allergy    Claritin; seasonal.  . Arthritis    Hands, hips, knees.  Ibuprofen PRN.  . Fibromyalgia    dx by [facility]eschwar  . Hypothyroidism    goiter; onset age 71.  . Osteoporosis    Past Surgical History:  Procedure Laterality Date  . BREAST SURGERY     breast cyst resection; benign.  x 2.   Family History  Problem Relation Age of Onset  . Heart disease Mother 57        AMI x 2  . Hyperlipidemia Mother   . COPD Mother   . Stroke Mother 64  . Cancer Father        prostate cancer; lung cancer  . Heart disease Father   . Hyperlipidemia Father   . Hypertension Father   . Alcohol abuse Father   . Cancer Sister 20       bone cancer? s/p radiation and chemotherapy  . Hyperlipidemia Sister   . Hypertension Sister   . Diabetes Brother   . Hyperlipidemia Brother   . Hypertension Brother   . Stroke Brother   . Heart disease Brother   . Hyperlipidemia Brother   . Hypertension Brother   . Stroke Brother   . Diabetes Sister   . Hyperlipidemia Sister   . Hypertension Sister   . Anxiety disorder Sister    Social History   Socioeconomic History  . Marital status: Widowed    Spouse name: Widowed  . Number of children: 1  . Years of education: None  . Highest education level: None  Social Needs  . Financial resource strain: Not hard at all  . Food insecurity - worry: Never true  . Food insecurity - inability: Never true  . Transportation needs - medical: No  . Transportation needs - non-medical: No  Occupational History  .  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  Tobacco Use  . Smoking status: Never Smoker  . Smokeless tobacco: Never Used  Substance and Sexual Activity  . Alcohol use: No    Alcohol/week: 0.0 oz  . Drug use: No  . Sexual activity: Not Currently    Partners: Male  Other Topics Concern  . None  Social History Narrative   Marital status:  Widowed; husband died suddenly (MS, pacemaker, atrial fibrillation on Coumadin; head trauma with bleed s/p emergency surgery with persistent bleeding).  2013.  Not dating; not interested.        Children:  1 daughter; 1 stepson; 2 stepgrandchildren.  Daughter in [place]; [place] in [place].      Lives: alone; has dog and cat.      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        Tobacco:  never       Alcohol:  Rarely; holidays      Exercise:  Walking 2 miles per day.      Seatbelts:  100%      Guns:  None      ADLs:  Independent with all ADLs; drives/cooks/finances.  No assistant devices.        Advanced Directives:  Living Will; HCPOA is daughter/Alyssa Grant [phone]); FULL CODE but no prolonged measures.               Outpatient Encounter Medications as of 01/14/2018  Medication Sig  . ferrous sulfate 325 (65 FE) MG tablet Take 325 mg by mouth daily with breakfast.  . levothyroxine (SYNTHROID, LEVOTHROID) 75 MCG tablet Take 1 tablet (75 mcg total) by mouth daily before breakfast.  . liothyronine (CYTOMEL) 25 MCG tablet Take 1 tablet (25 mcg total) by mouth daily.  . Multiple Vitamin (MULTIVITAMIN) tablet Take 1 tablet by mouth daily.  Marland Kitchen OVER THE COUNTER MEDICATION OTC Fish Oil 320 mg EPA, 200 mg DHA taking daily  . OVER THE COUNTER MEDICATION Magnesium Malate 1250 mg taking daily  . OVER THE COUNTER MEDICATION Vitamin D 1000 iu taking daily  . OVER THE COUNTER MEDICATION Biotin 5000 mcg taking daily  . vitamin E 400 UNIT capsule Take 400 Units by mouth daily.  . [DISCONTINUED] zoster vaccine live, PF, (ZOSTAVAX) 86578 UNT/0.65ML injection Inject 19,400 Units into the skin once.  . [DISCONTINUED] OVER THE COUNTER MEDICATION Aspirin 81 mg taking daily   No facility-administered encounter medications on file as of 01/14/2018.     Activities of Daily Living In your present state of health, do you have any difficulty performing the following activities: 01/14/2018  Hearing? N  Vision? N  Difficulty concentrating or making decisions? N  Walking or climbing stairs? N  Dressing or bathing? N  Doing errands, shopping? N  Preparing Food and eating ? N  Using the Toilet? N  In the past six months, have you accidently leaked urine? N  Do you have problems with loss of bowel control? N  Managing your Medications? N    Managing your Finances? N  Housekeeping or managing your Housekeeping? N  Some recent data might be hidden    Patient Care Team: Alyssa Chick, MD as PCP - General (Family Medicine) Alyssa Lat, MD as Consulting Physician (Ophthalmology)    Assessment:   This is a routine wellness examination for Talicia.  Exercise  Activities and Dietary recommendations Current Exercise Habits: Home exercise routine, Type of exercise: walking, Time (Minutes): 40, Frequency (Times/Week): 7, Weekly Exercise (Minutes/Week): 280, Intensity: Mild, Exercise limited by: None identified  Goals    .  swimming     Patient states that she wants to start swimming 1-3 times a week.        Fall Risk Fall Risk  01/14/2018 11/14/2017 07/09/2017 01/02/2017 12/27/2014  Falls in the past year? No No No No No   Is the patient's home free of loose throw rugs in walkways, pet beds, electrical cords, etc?   yes      Grab bars in the bathroom? yes      Handrails on the stairs?   yes      Adequate lighting?   yes  Timed Get Up and Go performed: yes, completed within 30 seconds   Depression Screen PHQ 2/9 Scores 01/14/2018 11/14/2017 07/09/2017 01/02/2017  PHQ - 2 Score 0 0 0 0     Cognitive Function     6CIT Screen 01/14/2018  What Year? 0 points  What month? 0 points  What time? 0 points  Count back from 20 0 points  Months in reverse 0 points  Repeat phrase 0 points  Total Score 0    Immunization History  Administered Date(s) Administered  . Influenza Split 08/23/2017  . Influenza,inj,Quad PF,6+ Mos 07/28/2014, 07/18/2015  . Influenza-Unspecified 08/01/2016  . Pneumococcal Conjugate-13 12/27/2014  . Pneumococcal Polysaccharide-23 12/30/2015  . Tdap 10/29/2000    Qualifies for Shingles Vaccine?Advised patient to check with her pharmacy about receiving the Shingrix vaccine  Screening Tests Health Maintenance  Topic Date Due  . MAMMOGRAM  01/26/2017  . COLONOSCOPY  01/15/2019 (Originally  10/29/2013)  . TETANUS/TDAP  01/15/2019 (Originally 10/29/2010)  . INFLUENZA VACCINE  Completed  . DEXA SCAN  Completed  . Hepatitis C Screening  Completed  . PNA vac Low Risk Adult  Completed    Cancer Screenings: Lung: Low Dose CT Chest recommended if Age 31-80 years, 30 pack-year currently smoking OR have quit w/in 15years. Patient does not qualify. Breast:  Up to date on Mammogram? No, [facility] sent to [facility] to schedule  Up to date of Bone Density/Dexa? No,  order sent to [facility] to schedule  Colorectal: Patient declined colonoscopy  Additional Screenings:  Hepatitis B/HIV/Syphillis: not indicated  Hepatitis C Screening: not indicated     Patient declined Tdap due to insurance/financial.  Plan:   I have personally reviewed and noted the following in the patient's chart:   . Medical and social history . Use of alcohol, tobacco or illicit drugs  . Current medications and supplements . Functional ability and status . Nutritional status . Physical activity . Advanced directives . List of other physicians . Hospitalizations, surgeries, and ER visits in previous 12 months . Vitals . Screenings to include cognitive, depression, and falls . Referrals and appointments  In addition, I have reviewed and discussed with patient certain preventive protocols, quality metrics, and best practice recommendations. A written personalized care plan for preventive services as well as general preventive health recommendations were provided to patient.   1. Screening cholesterol level - Lipid panel  2. Screening for diabetes mellitus - Comprehensive metabolic panel  3. Screening, anemia, deficiency, iron - CBC with Differential/Platelet  4. Hypothyroidism, unspecified type - TSH  5. Estrogen deficiency - DEXAScan  6. Encounter for screening mammogram for breast cancer - Mammogram Digital Screening  7. Encounter for Medicare annual  wellness exam    Alyssa ShyJohnson, [place],  Grant  4/40/34743/19/2019

## 2018-01-14 NOTE — Patient Instructions (Addendum)
Alyssa Grant , Thank you for taking time to come for your Medicare Wellness Visit. I appreciate your ongoing commitment to your health goals. Please review the following plan we discussed and let me know if I can assist you in the future.   Screening recommendations/referrals: Colonoscopy: declined Mammogram: referral sent to breast center, They will call you to setup appointment Bone Density: referral sent to breast center, They will call you to setup appointment Recommended yearly ophthalmology/optometry visit for glaucoma screening and checkup Recommended yearly dental visit for hygiene and checkup  Vaccinations: Influenza vaccine: up to date Pneumococcal vaccine: up to date Tdap vaccine: declined due to insurance Shingles vaccine: Check with your pharmacy about receiving the Shingrix vaccine     Advanced directives: Please bring a copy of your POA (Power of [place]) and/or Living Will to your next appointment.   Conditions/risks identified: Try to start swimming 1-3 times a week.   Next appointment: 01/15/2018 @ 9 am with Dr. Katrinka BlazingSmith, Next AWV in 1 year    Preventive Care 65 Years and Older, Female Preventive care refers to lifestyle choices and visits with your health care provider that can promote health and wellness. What does preventive care include?  A yearly physical exam. This is also called an annual well check.  Dental exams once or twice a year.  Routine eye exams. Ask your health care provider how often you should have your eyes checked.  Personal lifestyle choices, including:  Daily care of your teeth and gums.  Regular physical activity.  Eating a healthy diet.  Avoiding tobacco and drug use.  Limiting alcohol use.  Practicing safe sex.  Taking low-dose aspirin every day.  Taking vitamin and mineral supplements as recommended by your health care provider. What happens during an annual well check? The services and screenings done by your health care  provider during your annual well check will depend on your age, overall health, lifestyle risk factors, and family history of disease. Counseling  Your health care provider may ask you questions about your:  Alcohol use.  Tobacco use.  Drug use.  Emotional well-being.  Home and relationship well-being.  Sexual activity.  Eating habits.  History of falls.  Memory and ability to understand (cognition).  Work and work Astronome[employer]vironment.  Reproductive health. Screening  You may have the following tests or measurements:  Height, weight, and BMI.  Blood pressure.  Lipid and cholesterol levels. These may be checked every 5 years, or more frequently if you are over 69 years old.  Skin check.  Lung cancer screening. You may have this screening every year starting at age 69 if you have a 30-pack-year history of smoking and currently smoke or have quit within the past 15 years.  Fecal occult blood test (FOBT) of the stool. You may have this test every year starting at age 69.  Flexible sigmoidoscopy or colonoscopy. You may have a sigmoidoscopy every 5 years or a colonoscopy every 10 years starting at age 69.  Hepatitis C blood test.  Hepatitis B blood test.  Sexually transmitted disease (STD) testing.  Diabetes screening. This is done by checking your blood sugar (glucose) after you have not eaten for a while (fasting). You may have this done every 1-3 years.  Bone density scan. This is done to screen for osteoporosis. You may have this done starting at age 69.  Mammogram. This may be done every 1-2 years. Talk to your health care provider about how often you should have regular mammograms.  Talk with your health care provider about your test results, treatment options, and if necessary, the need for more tests. Vaccines  Your health care provider may recommend certain vaccines, such as:  Influenza vaccine. This is recommended every year.  Tetanus, diphtheria, and acellular  pertussis (Tdap, Td) vaccine. You may need a Td booster every 10 years.  Zoster vaccine. You may need this after age 55.  Pneumococcal 13-valent conjugate (PCV13) vaccine. One dose is recommended after age 50.  Pneumococcal polysaccharide (PPSV23) vaccine. One dose is recommended after age 13. Talk to your health care provider about which screenings and vaccines you need and how often you need them. This information is not intended to replace advice given to you by your health care provider. Make sure you discuss any questions you have with your health care provider. Document Released: 11/11/2015 Document Revised: 07/04/2016 Document Reviewed: 08/16/2015 Elsevier Interactive Patient Education  2017 [employer] Prevention in the Home Falls can cause injuries. They can happen to people of all ages. There are many things you can do to make your home safe and to help prevent falls. What can I do on the outside of my home?  Regularly fix the edges of walkways and driveways and fix any cracks.  Remove anything that might make you trip as you walk through a door, such as a raised step or threshold.  Trim any bushes or trees on the path to your home.  Use bright outdoor lighting.  Clear any walking paths of anything that might make someone trip, such as rocks or tools.  Regularly check to see if handrails are loose or broken. Make sure that both sides of any steps have handrails.  Any raised decks and porches should have guardrails on the edges.  Have any leaves, snow, or ice cleared regularly.  Use sand or salt on walking paths during winter.  Clean up any spills in your garage right away. This includes oil or grease spills. What can I do in the bathroom?  Use night lights.  Install grab bars by the toilet and in the tub and shower. Do not use towel bars as grab bars.  Use non-skid mats or decals in the tub or shower.  If you need to sit down in the shower, use a plastic,  non-slip stool.  Keep the floor dry. Clean up any water that spills on the floor as soon as it happens.  Remove soap buildup in the tub or shower regularly.  Attach bath mats securely with double-sided non-slip rug tape.  Do not have throw rugs and other things on the floor that can make you trip. What can I do in the bedroom?  Use night lights.  Make sure that you have a light by your bed that is easy to reach.  Do not use any sheets or blankets that are too big for your bed. They should not hang down onto the floor.  Have a firm chair that has side arms. You can use this for support while you get dressed.  Do not have throw rugs and other things on the floor that can make you trip. What can I do in the kitchen?  Clean up any spills right away.  Avoid walking on wet floors.  Keep items that you use a lot in easy-to-reach places.  If you need to reach something above you, use a strong step stool that has a grab bar.  Keep electrical cords out of the way.  Do not use floor polish or wax that makes floors slippery. If you must use wax, use non-skid floor wax.  Do not have throw rugs and other things on the floor that can make you trip. What can I do with my stairs?  Do not leave any items on the stairs.  Make sure that there are handrails on both sides of the stairs and use them. Fix handrails that are broken or loose. Make sure that handrails are as long as the stairways.  Check any carpeting to make sure that it is firmly attached to the stairs. Fix any carpet that is loose or worn.  Avoid having throw rugs at the top or bottom of the stairs. If you do have throw rugs, attach them to the floor with carpet tape.  Make sure that you have a light switch at the top of the stairs and the bottom of the stairs. If you do not have them, ask someone to add them for you. What else can I do to help prevent falls?  Wear shoes that:  Do not have high heels.  Have rubber  bottoms.  Are comfortable and fit you well.  Are closed at the toe. Do not wear sandals.  If you use a stepladder:  Make sure that it is fully opened. Do not climb a closed stepladder.  Make sure that both sides of the stepladder are locked into place.  Ask someone to hold it for you, if possible.  Clearly mark and make sure that you can see:  Any grab bars or handrails.  First and last steps.  Where the edge of each step is.  Use tools that help you move around (mobility aids) if they are needed. These include:  Canes.  Walkers.  Scooters.  Crutches.  Turn on the lights when you go into a dark area. Replace any light bulbs as soon as they burn out.  Set up your furniture so you have a clear path. Avoid moving your furniture around.  If any of your floors are uneven, fix them.  If there are any pets around you, be aware of where they are.  Review your medicines with your doctor. Some medicines can make you feel dizzy. This can increase your chance of falling. Ask your doctor what other things that you can do to help prevent falls. This information is not intended to replace advice given to you by your health care provider. Make sure you discuss any questions you have with your health care provider. Document Released: 08/11/2009 Document Revised: 03/22/2016 Document Reviewed: 11/19/2014 Elsevier Interactive Patient Education  2017 [employer].

## 2018-01-15 ENCOUNTER — Ambulatory Visit (INDEPENDENT_AMBULATORY_CARE_PROVIDER_SITE_OTHER): Payer: PPO | Admitting: Family Medicine

## 2018-01-15 ENCOUNTER — Encounter: Payer: Self-pay | Admitting: Family Medicine

## 2018-01-15 VITALS — BP 106/66 | HR 89 | Temp 98.0°F | Resp 16 | Ht 62.0 in | Wt 101.6 lb

## 2018-01-15 DIAGNOSIS — E034 Atrophy of thyroid (acquired): Secondary | ICD-10-CM | POA: Diagnosis not present

## 2018-01-15 DIAGNOSIS — Z131 Encounter for screening for diabetes mellitus: Secondary | ICD-10-CM

## 2018-01-15 DIAGNOSIS — M810 Age-related osteoporosis without current pathological fracture: Secondary | ICD-10-CM

## 2018-01-15 DIAGNOSIS — M797 Fibromyalgia: Secondary | ICD-10-CM | POA: Diagnosis not present

## 2018-01-15 DIAGNOSIS — Z0000 Encounter for general adult medical examination without abnormal findings: Secondary | ICD-10-CM

## 2018-01-15 DIAGNOSIS — Z1211 Encounter for screening for malignant neoplasm of colon: Secondary | ICD-10-CM | POA: Diagnosis not present

## 2018-01-15 DIAGNOSIS — R636 Underweight: Secondary | ICD-10-CM | POA: Diagnosis not present

## 2018-01-15 LAB — COMPREHENSIVE METABOLIC PANEL
ALT: 13 IU/L (ref 0–32)
AST: 20 IU/L (ref 0–40)
Albumin/Globulin Ratio: 1.4 (ref 1.2–2.2)
Albumin: 4.3 g/dL (ref 3.6–4.8)
Alkaline Phosphatase: 75 IU/L (ref 39–117)
BUN/Creatinine Ratio: 16 (ref 12–28)
BUN: 12 mg/dL (ref 8–27)
Bilirubin Total: 0.9 mg/dL (ref 0.0–1.2)
CALCIUM: 9.9 mg/dL (ref 8.7–10.3)
CO2: 26 mmol/L (ref 20–29)
CREATININE: 0.73 mg/dL (ref 0.57–1.00)
Chloride: 101 mmol/L (ref 96–106)
GFR calc Af Amer: 98 mL/min/{1.73_m2} (ref >59)
GFR, EST NON AFRICAN AMERICAN: 85 mL/min/{1.73_m2} (ref >59)
Globulin, Total: 3.1 g/dL (ref 1.5–4.5)
Glucose: 87 mg/dL (ref 65–99)
POTASSIUM: 4.5 mmol/L (ref 3.5–5.2)
Sodium: 141 mmol/L (ref 134–144)
Total Protein: 7.4 g/dL (ref 6.0–8.5)

## 2018-01-15 LAB — CBC WITH DIFFERENTIAL/PLATELET
BASOS: 1 %
Basophils Absolute: 0 10*3/uL (ref 0.0–0.2)
EOS (ABSOLUTE): 0.1 10*3/uL (ref 0.0–0.4)
EOS: 1 %
Hematocrit: 40.7 % (ref 34.0–46.6)
Hemoglobin: 13.8 g/dL (ref 11.1–15.9)
IMMATURE GRANS (ABS): 0 10*3/uL (ref 0.0–0.1)
Immature Granulocytes: 0 %
Lymphocytes Absolute: 1.9 10*3/uL (ref 0.7–3.1)
Lymphs: 32 %
MCH: 31.1 pg (ref 26.6–33.0)
MCHC: 33.9 g/dL (ref 31.5–35.7)
MCV: 92 fL (ref 79–97)
Monocytes Absolute: 0.6 10*3/uL (ref 0.1–0.9)
Monocytes: 10 %
Neutrophils Absolute: 3.2 10*3/uL (ref 1.4–7.0)
Neutrophils: 56 %
PLATELETS: 189 10*3/uL (ref 150–379)
RBC: 4.44 x10E6/uL (ref 3.77–5.28)
RDW: 13.3 % (ref 12.3–15.4)
WBC: 5.8 10*3/uL (ref 3.4–10.8)

## 2018-01-15 LAB — POCT URINALYSIS DIP (MANUAL ENTRY)
BILIRUBIN UA: NEGATIVE mg/dL
Bilirubin, UA: NEGATIVE
Blood, UA: NEGATIVE
Glucose, UA: NEGATIVE mg/dL
LEUKOCYTES UA: NEGATIVE
Nitrite, UA: NEGATIVE
PH UA: 5.5 (ref 5.0–8.0)
Protein Ur, POC: NEGATIVE mg/dL
SPEC GRAV UA: 1.015 (ref 1.010–1.025)
Urobilinogen, UA: 0.2 E.U./dL

## 2018-01-15 LAB — LIPID PANEL
CHOL/HDL RATIO: 2.1 ratio (ref 0.0–4.4)
Cholesterol, Total: 199 mg/dL (ref 100–199)
HDL: 94 mg/dL (ref >39)
LDL Calculated: 97 mg/dL (ref 0–99)
Triglycerides: 40 mg/dL (ref 0–149)
VLDL Cholesterol Cal: 8 mg/dL (ref 5–40)

## 2018-01-15 LAB — TSH: TSH: 0.414 u[IU]/mL — AB (ref 0.450–4.500)

## 2018-01-15 MED ORDER — LIOTHYRONINE SODIUM 25 MCG PO TABS: 25.0000 ug | ORAL_TABLET | Freq: Every day | ORAL | 3 refills | Status: DC

## 2018-01-15 MED ORDER — LEVOTHYROXINE SODIUM 75 MCG PO TABS: 75.0000 ug | ORAL_TABLET | Freq: Every day | ORAL | 3 refills | Status: DC

## 2018-01-15 MED ORDER — ZOSTER VAC RECOMB ADJUVANTED 50 MCG/0.5ML IM SUSR: 0.5000 mL | Freq: Once | INTRAMUSCULAR | 1 refills | Status: AC

## 2018-01-15 NOTE — Patient Instructions (Addendum)
We recommend that you schedule a mammogram for breast cancer screening. Typically, you do not need a referral to do this. Please contact a local imaging center to schedule your mammogram.  [facility] - [phone]  *ask for the Radiology Department [facility]) - [phone] or [phone]) [phone]  [facility] - [phone] [facility] - [phone]) [phone] [facility] - [phone]  *ask for the Radiology Department [facility] - [phone]  *ask for the Radiology Department [facility] - [phone]  *ask for the Mammography Department [facility] - [phone]) [phone]    IF you received an x-ray today, you will receive an invoice from [facility]. Please contact [facility] at [phone] with questions or concerns regarding your invoice.   IF you received labwork today, you will receive an invoice from [facility]. Please contact [facility] at [phone] with questions or concerns regarding your invoice.   Our billing staff will not be able to assist you with questions regarding bills from these companies.  You will be contacted with the lab results as soon as they are available. The fastest way to get your results is to activate your My Chart account. Instructions are located on the last page of this paperwork. If you have not heard from us regarding the results in 2 weeks, please contact this office.      Preventing Cerebrovascular Disease Arteries are blood vessels that carry blood that contains oxygen from the heart to all parts of the body. Cerebrovascular disease affects arteries that supply the brain. Any condition that blocks or disrupts blood flow to the brain can cause cerebrovascular disease. Brain cells that lose blood supply start to die within minutes (stroke). Stroke is the main danger of cerebrovascular disease. Atherosclerosis and high blood pressure are  common causes of cerebrovascular disease. Atherosclerosis is narrowing and hardening of an artery that results when fat, cholesterol, calcium, or other substances (plaque) build up inside an artery. Plaque reduces blood flow through the artery. High blood pressure increases the risk of bleeding inside the brain. Making diet and lifestyle changes to prevent atherosclerosis and high blood pressure lowers your risk of cerebrovascular disease. What nutrition changes can be made?  Eat more fruits, vegetables, and whole grains.  Reduce how much saturated fat you eat. To do this, eat less red meat and fewer full-fat dairy products.  Eat healthy proteins instead of red meat. Healthy proteins include: ? Fish. Eat fish that contains heart-healthy omega-3 fatty acids, twice a week. Examples include salmon, albacore tuna, mackerel, and herring. ? Chicken. ? Nuts. ? Low-fat or nonfat yogurt.  Avoid processed meats, like bacon and lunchmeat.  Avoid foods that contain: ? A lot of sugar, such as sweets and drinks with added sugar. ? A lot of salt (sodium). Avoid adding extra salt to your food, as told by your health care provider. ? Trans fats, such as margarine and baked goods. Trans fats may be listed as "partially hydrogenated oils" on food labels.  Check food labels to see how much sodium, sugar, and trans fats are in foods.  Use vegetable oils that contain low amounts of saturated fat, such as olive oil or canola oil. What lifestyle changes can be made?  Drink alcohol in moderation. This means no more than 1 drink a day for nonpregnant women and 2 drinks a day for men. One drink equals 12 oz of beer, 5 oz of wine, or 1 oz  of hard liquor.  If you are overweight, ask your health care provider to recommend a weight-loss plan for you. Losing 5-10 lb (2.2-4.5 kg) can reduce your risk of diabetes, atherosclerosis, and high blood pressure.  Exercise for 30?60 minutes on most days, or as much as told by  your health care provider. ? Do moderate-intensity exercise, such as brisk walking, bicycling, and water aerobics. Ask your health care provider which activities are safe for you.  Do not use any products that contain nicotine or tobacco, such as cigarettes and e-cigarettes. If you need help quitting, ask your health care provider. Why are these changes important? Making these changes lowers your risk of many diseases that can cause cerebrovascular disease and stroke. Stroke is a leading cause of death and disability. Making these changes also improves your overall health and quality of life. What can I do to lower my risk? The following factors make you more likely to develop cerebrovascular disease:  Being overweight.  Smoking.  Being physically inactive.  Eating a high-fat diet.  Having certain health conditions, such as: ? Diabetes. ? High blood pressure. ? Heart disease. ? Atherosclerosis. ? High cholesterol. ? Sickle cell disease.  Talk with your health care provider about your risk for cerebrovascular disease. Work with your health care provider to control diseases that you have that may contribute to cerebrovascular disease. Your health care provider may prescribe medicines to help prevent major causes of cerebrovascular disease. Where to find more information: Learn more about preventing cerebrovascular disease from:  National Heart, Lung, and Blood Institute: Class[employer].pl  Centers for Disease Control and Prevention: http://www.gonzalez-chase.net/  Summary  Cerebrovascular disease can lead to a stroke.  Atherosclerosis and high blood pressure are major causes of cerebrovascular disease.  Making diet and lifestyle changes can reduce your risk of cerebrovascular disease.  Work with your health care provider to get your risk factors under control to reduce your risk of cerebrovascular disease. This information is not intended to  replace advice given to you by your health care provider. Make sure you discuss any questions you have with your health care provider. Document Released: 10/30/2015 Document Revised: 05/04/2016 Document Reviewed: 10/30/2015 Elsevier Interactive Patient Education  2018 [employer].

## 2018-01-15 NOTE — Progress Notes (Signed)
Subjective:    Patient ID: Alyssa Grant, female    DOB: November 10, 1950, 67 y.o.   MRN: [id number]  01/15/2018  Annual Exam    HPI This 67 y.o. female presents for COMPLETE PHYSICAL EXAMINATION and follow-up for chronic medical conditions.  Last physical:  01-02-2017 Pap smear:  2016 Mammogram: 2016 Colonoscopy:  2005; REFUSES. Bone density:  2016    Visual Acuity Screening   Right eye Left eye Both eyes  Without correction:     With correction: 20/25 -1 20/25 20/25    BP Readings from Last 3 Encounters:  01/15/18 106/66  01/14/18 110/78  11/14/17 116/73   Wt Readings from Last 3 Encounters:  01/15/18 101 lb 9.6 oz (46.1 kg)  01/14/18 101 lb 6 oz (46 kg)  11/14/17 103 lb (46.7 kg)   Immunization History  Administered Date(s) Administered  . Influenza Split 08/23/2017  . Influenza,inj,Quad PF,6+ Mos 07/28/2014, 07/18/2015  . Influenza-Unspecified 08/01/2016  . Pneumococcal Conjugate-13 12/27/2014  . Pneumococcal Polysaccharide-23 12/30/2015  . Tdap 10/29/2000   Health Maintenance  Topic Date Due  . MAMMOGRAM  01/26/2017  . COLONOSCOPY  01/15/2019 (Originally 10/29/2013)  . TETANUS/TDAP  01/15/2019 (Originally 10/29/2010)  . INFLUENZA VACCINE  Completed  . DEXA SCAN  Completed  . Hepatitis C Screening  Completed  . PNA vac Low Risk Adult  Completed   Hypothyroidism: Patient reports good compliance with medication, good tolerance to medication, and good symptom control.    Osteoporosis: due for repeat bone density scan; last bone density scan obtained in 2016.  Review of Systems  Constitutional: Negative for activity change, appetite change, chills, diaphoresis, fatigue, fever and unexpected weight change.  HENT: Negative for congestion, dental problem, drooling, ear discharge, ear pain, facial swelling, hearing loss, mouth sores, nosebleeds, postnasal drip, rhinorrhea, sinus pressure, sneezing, sore throat, tinnitus, trouble swallowing and voice change.   Eyes:  Negative for photophobia, pain, discharge, redness, itching and visual disturbance.  Respiratory: Negative for apnea, cough, choking, chest tightness, shortness of breath, wheezing and stridor.   Cardiovascular: Negative for chest pain, palpitations and leg swelling.  Gastrointestinal: Negative for abdominal distention, abdominal pain, anal bleeding, blood in stool, constipation, diarrhea, nausea, rectal pain and vomiting.  Endocrine: Negative for cold intolerance, heat intolerance, polydipsia, polyphagia and polyuria.  Genitourinary: Negative for decreased urine volume, difficulty urinating, dyspareunia, dysuria, enuresis, flank pain, frequency, genital sores, hematuria, menstrual problem, pelvic pain, urgency, vaginal bleeding, vaginal discharge and vaginal pain.       Nocturia x 1.  No urinary leakage.   Musculoskeletal: Negative for arthralgias, back pain, gait problem, joint swelling, myalgias, neck pain and neck stiffness.  Skin: Negative for color change, pallor, rash and wound.  Allergic/Immunologic: Negative for environmental allergies, food allergies and immunocompromised state.  Neurological: Negative for dizziness, tremors, seizures, syncope, facial asymmetry, speech difficulty, weakness, light-headedness, numbness and headaches.  Hematological: Negative for adenopathy. Does not bruise/bleed easily.  Psychiatric/Behavioral: Negative for agitation, behavioral problems, confusion, decreased concentration, dysphoric mood, hallucinations, self-injury, sleep disturbance and suicidal ideas. The patient is not nervous/anxious and is not hyperactive.        Bedtime 08-1129; wakes up 630.    Past Medical History:  Diagnosis Date  . Allergy    Claritin; seasonal.  . Arthritis    Hands, hips, knees.  Ibuprofen PRN.  . Fibromyalgia    dx by [facility]esch[facility]  . Hypothyroidism    goiter; onset age 58.  . Osteoporosis    Past Surgical History:  Procedure Laterality  Date  . BREAST SURGERY      breast cyst resection; benign.  x 2.   Allergies  Allergen Reactions  . Levaquin [Levofloxacin]   . Penicillins     Rash/swelling  . Sulfa Antibiotics     rash   Current Outpatient Medications on File Prior to Visit  Medication Sig Dispense Refill  . ferrous sulfate 325 (65 FE) MG tablet Take 325 mg by mouth as needed.     . Multiple Vitamin (MULTIVITAMIN) tablet Take 1 tablet by mouth daily.    Marland Kitchen. OVER THE COUNTER MEDICATION OTC Fish Oil 320 mg EPA, 200 mg DHA taking daily    . OVER THE COUNTER MEDICATION Magnesium Malate 1250 mg taking daily    . OVER THE COUNTER MEDICATION Vitamin D 2000 iu taking daily    . OVER THE COUNTER MEDICATION Biotin 5000 mcg taking daily    . vitamin E 400 UNIT capsule Take 400 Units by mouth daily.     No current facility-administered medications on file prior to visit.    Social History   Socioeconomic History  . Marital status: Widowed    Spouse name: Widowed  . Number of children: 1  . Years of education: Not on file  . Highest education level: Not on file  Social Needs  . Financial resource strain: Not hard at all  . Food insecurity - worry: Never true  . Food insecurity - inability: Never true  . Transportation needs - medical: No  . Transportation needs - non-medical: No  Occupational History  . 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  Tobacco Use  . Smoking status: Never Smoker  . Smokeless tobacco: Never Used  Substance and Sexual Activity  . Alcohol use: No    Alcohol/week: 0.0 oz  . Drug use: No  . Sexual activity: Not Currently    Partners: Male    Birth control/protection: Post-menopausal  Other Topics Concern  . Not on file  Social History Narrative   Marital status:  Widowed; husband died suddenly (MS, pacemaker, atrial fibrillation on Coumadin; head trauma with bleed s/p emergency surgery with persistent bleeding).  2013.  Not dating in 2019; not interested.        Children:  1  daughter; 1 stepson; 2 stepgrandchildren.  [place] in [place]; [place] in [place].      Lives: alone; has dog and cat.      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        Tobacco:  never       Alcohol:  Rarely; holidays      Exercise:  Walking 2 miles per day.      Seatbelts:  100%      Guns:  None      ADLs:  Independent with all ADLs; drives/cooks/finances.  No assistant devices.        Advanced Directives:  Living Will; HCPOA is daughter/Amber Marlou Starksebekah [place] [phone]); FULL CODE but no prolonged measures.              Family History  Problem Relation Age of Onset  . Heart disease Mother [id number]       AMI x 2  . Hyperlipidemia Mother   . COPD Mother   . Stroke Mother [id number]  . Cancer Father        prostate cancer; lung cancer  .  Heart disease Father   . Hyperlipidemia Father   . Hypertension Father   . Alcohol abuse Father   . Cancer Sister 20       bone cancer? s/p radiation and chemotherapy  . Hyperlipidemia Sister   . Hypertension Sister   . Diabetes Brother   . Hyperlipidemia Brother   . Hypertension Brother   . Stroke Brother   . Heart disease Brother   . Hyperlipidemia Brother   . Hypertension Brother   . Stroke Brother   . Diabetes Sister   . Hyperlipidemia Sister   . Hypertension Sister   . Heart disease Sister 46       CABG/CAD  . Anxiety disorder Sister        Objective:    BP 106/66 (BP Location: Left Arm, Patient Position: Sitting, Cuff Size: Normal)   Pulse 89   Temp 98 F (36.7 C) (Oral)   Resp 16   Ht 5\' 2"  (1.575 m)   Wt 101 lb 9.6 oz (46.1 kg)   SpO2 97%   BMI 18.58 kg/m  Physical Exam  Constitutional: She is oriented to person, place, and time. She appears well-developed and well-nourished. No distress.  HENT:  Head: Normocephalic and atraumatic.  Right Ear: Hearing, tympanic membrane, external ear and ear canal normal.  Left Ear:  Hearing, tympanic membrane, external ear and ear canal normal.  Nose: Nose normal.  Mouth/Throat: Oropharynx is clear and moist.  Eyes: Conjunctivae and EOM are normal. Pupils are equal, round, and reactive to light.  Neck: Normal range of motion and full passive range of motion without pain. Neck supple. No JVD present. Carotid bruit is not present. No thyromegaly present.  Cardiovascular: Normal rate, regular rhythm, normal heart sounds and intact distal pulses. Exam reveals no gallop and no friction rub.  No murmur heard. Pulmonary/Chest: Effort normal and breath sounds normal. No respiratory distress. She has no wheezes. She has no rales. Right breast exhibits no inverted nipple, no mass, no nipple discharge, no skin change and no tenderness. Left breast exhibits no inverted nipple, no mass, no nipple discharge, no skin change and no tenderness. Breasts are symmetrical.  Abdominal: Soft. Bowel sounds are normal. She exhibits no distension and no mass. There is no tenderness. There is no rebound and no guarding.  Musculoskeletal:       Right shoulder: Normal.       Left shoulder: Normal.       Cervical back: Normal.  Lymphadenopathy:    She has no cervical adenopathy.  Neurological: She is alert and oriented to person, place, and time. She has normal reflexes. No cranial nerve deficit. She exhibits normal muscle tone. Coordination normal.  Skin: Skin is warm and dry. No rash noted. She is not diaphoretic. No erythema. No pallor.  Psychiatric: She has a normal mood and affect. Her behavior is normal. Judgment and thought content normal.  Nursing note and vitals reviewed.  No results found. Depression screen [facility] 2/9 01/15/2018 01/14/2018 11/14/2017 07/09/2017 01/02/2017  Decreased Interest 0 0 0 0 0  Down, Depressed, Hopeless 0 0 0 0 0  PHQ - 2 Score 0 0 0 0 0   Fall Risk  01/15/2018 01/14/2018 11/14/2017 07/09/2017 01/02/2017  Falls in the past year? No No No No No   Functional Status Survey: Is  the patient deaf or have difficulty hearing?: No Does the patient have difficulty seeing, even when wearing glasses/contacts?: No Does the patient have difficulty concentrating, remembering, or making decisions?:  No Does the patient have difficulty walking or climbing stairs?: No Does the patient have difficulty dressing or bathing?: No Does the patient have difficulty doing errands alone such as visiting a doctor's office or shopping?: No      Assessment & Plan:   1. Routine physical examination   2. Hypothyroidism due to acquired atrophy of thyroid   3. Age-related osteoporosis without current pathological fracture   4. Underweight   5. Fibromyalgia   6. Screening for diabetes mellitus   7. Colon cancer screening     -anticipatory guidance provided --- exercise, weight GAIN, safe driving practices, aspirin 81mg  daily, calcium 600mg  twice daily or 3 servings of dairy daily. -obtain age appropriate screening labs and labs for chronic disease management. -moderate fall risk; no evidence of depression; no evidence of hearing loss.  Discussed advanced directives and living will; also discussed end of life issues including code status.  -REFUSES COLONOSCOPY yet agreeable to cologuard.   -refills provided.  Orders Placed This Encounter  Procedures  . Cologuard  . POCT urinalysis dipstick   Meds ordered this encounter  Medications  . Zoster Vaccine Adjuvanted [facility]) injection    Sig: Inject 0.5 mLs into the muscle once for 1 dose.    Dispense:  0.5 mL    Refill:  1  . liothyronine (CYTOMEL) 25 MCG tablet    Sig: Take 1 tablet (25 mcg total) by mouth daily.    Dispense:  90 tablet    Refill:  3  . levothyroxine (SYNTHROID, LEVOTHROID) 75 MCG tablet    Sig: Take 1 tablet (75 mcg total) by mouth daily before breakfast.    Dispense:  90 tablet    Refill:  3    Return in about 1 year (around 01/16/2019) for complete physical examiniation.   Seaira Byus Paulita Fujita, M.D. Primary  Care at [facility] [address] [place], [place]  [address] [phone] phone [phone] fax

## 2018-01-28 ENCOUNTER — Encounter: Payer: Self-pay | Admitting: Physician Assistant

## 2018-02-06 DIAGNOSIS — H2513 Age-related nuclear cataract, bilateral: Secondary | ICD-10-CM | POA: Diagnosis not present

## 2018-02-06 DIAGNOSIS — H04123 Dry eye syndrome of bilateral lacrimal glands: Secondary | ICD-10-CM | POA: Diagnosis not present

## 2018-02-11 ENCOUNTER — Ambulatory Visit
Admission: RE | Admit: 2018-02-11 | Discharge: 2018-02-11 | Disposition: A | Discharge status: Home / Self Care | Payer: PPO | Source: Ambulatory Visit | Attending: Family Medicine | Admitting: Family Medicine

## 2018-02-11 DIAGNOSIS — M85851 Other specified disorders of bone density and structure, right thigh: Secondary | ICD-10-CM | POA: Diagnosis not present

## 2018-02-11 DIAGNOSIS — Z1231 Encounter for screening mammogram for malignant neoplasm of breast: Secondary | ICD-10-CM | POA: Diagnosis not present

## 2018-02-11 DIAGNOSIS — M810 Age-related osteoporosis without current pathological fracture: Secondary | ICD-10-CM | POA: Diagnosis not present

## 2018-03-06 ENCOUNTER — Ambulatory Visit (INDEPENDENT_AMBULATORY_CARE_PROVIDER_SITE_OTHER): Payer: PPO | Admitting: Physician Assistant

## 2018-03-06 ENCOUNTER — Other Ambulatory Visit: Payer: Self-pay

## 2018-03-06 ENCOUNTER — Encounter: Payer: Self-pay | Admitting: Physician Assistant

## 2018-03-06 VITALS — BP 114/70 | HR 83 | Temp 98.5°F | Resp 18 | Ht 62.48 in | Wt 103.4 lb

## 2018-03-06 DIAGNOSIS — R002 Palpitations: Secondary | ICD-10-CM

## 2018-03-06 DIAGNOSIS — M797 Fibromyalgia: Secondary | ICD-10-CM | POA: Diagnosis not present

## 2018-03-06 DIAGNOSIS — M2550 Pain in unspecified joint: Secondary | ICD-10-CM | POA: Diagnosis not present

## 2018-03-06 DIAGNOSIS — E034 Atrophy of thyroid (acquired): Secondary | ICD-10-CM | POA: Diagnosis not present

## 2018-03-06 DIAGNOSIS — W57XXXA Bitten or stung by nonvenomous insect and other nonvenomous arthropods, initial encounter: Secondary | ICD-10-CM | POA: Diagnosis not present

## 2018-03-06 MED ORDER — DOXYCYCLINE HYCLATE 100 MG PO CAPS: 100.0000 mg | ORAL_CAPSULE | Freq: Two times a day (BID) | ORAL | 0 refills | Status: DC

## 2018-03-06 NOTE — Progress Notes (Signed)
DEEANNE Grant  MRN: [id number] DOB: 06-21-1951  Subjective:  Alyssa Grant is a 67 y.o. 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 Has had some palpitations over the past 3 days, not present today. Denies rash, fever, chest pain, abdominal pain, nausea, and vomiting.   Has "tick calendar" with her. Pulled a dog tick off of her right lower leg on 02/27/18 after yard work. Used benedryl cream at the particular area.  Has PMH of hypothyroidism. Takes synthroid 75 mcg and cytomel 12.5 mcg. Last TSH 03/19 was 0.414. She recently decreased cytomel from to 12.5 mcg after last visit.   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   Review of Systems  Constitutional: Positive for fatigue. Negative for chills and diaphoresis.  Respiratory: Negative for cough and shortness of breath.   Cardiovascular: Negative for leg swelling.  Neurological: Negative for dizziness, light-headedness and numbness.    Patient Active Problem List   Diagnosis Date Noted  . Fibromyalgia 01/02/2017  . Osteoporosis 12/30/2015  . Underweight 12/28/2014  . Thyroid activity decreased 12/28/2014    Current Outpatient Medications on File Prior to Visit  Medication Sig Dispense Refill  . ferrous sulfate 325 (65 FE) MG tablet Take 325 mg by mouth as needed.     Marland Kitchen levothyroxine (SYNTHROID, LEVOTHROID) 75 MCG tablet Take 1 tablet (75 mcg total) by mouth daily before breakfast. 90 tablet 3  . liothyronine (CYTOMEL) 25 MCG tablet Take 1 tablet (25 mcg total) by mouth daily. 90 tablet 3  . Multiple Vitamin (MULTIVITAMIN) tablet Take 1 tablet by mouth daily.    Marland Kitchen OVER THE COUNTER MEDICATION OTC Fish Oil 320 mg EPA, 200 mg DHA taking daily    . OVER THE COUNTER MEDICATION Magnesium Malate 1250 mg taking daily    . OVER THE COUNTER MEDICATION Vitamin D 2000 iu taking daily     . OVER THE COUNTER MEDICATION Biotin 5000 mcg taking daily    . vitamin E 400 UNIT capsule Take 400 Units by mouth daily.     No current facility-administered medications on file prior to visit.     Allergies  Allergen Reactions  . Levaquin [Levofloxacin]   . Penicillins     Rash/swelling  . Sulfa Antibiotics     rash      Social History   Socioeconomic History  . Marital status: Widowed    Spouse name: Widowed  . Number of children: 1  . Years of education: Not on file  . Highest education level: Not on file  Occupational History  . 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  Social Needs  . Financial resource strain: Not hard at all  . Food insecurity:    Worry: Never true    Inability: Never true  . Transportation needs:    Medical: No    Non-medical: No  Tobacco Use  . Smoking status: Never Smoker  . Smokeless tobacco: Never Used  Substance and Sexual Activity  . Alcohol use: No    Alcohol/week: 0.0 oz  . Drug use: No  . Sexual activity: Not Currently    Partners: Male    Birth control/protection: Post-menopausal  Lifestyle  . Physical activity:    Days per week: 7 days    Minutes per session: 40 min  .  Stress: Not at all  Relationships  . Social connections:    Talks on phone: More than three times a week    Gets together: Once a week    Attends religious service: More than 4 times per year    Active member of club or organization: Yes    Attends meetings of clubs or organizations: More than 4 times per year    Relationship status: Widowed  . Intimate partner violence:    Fear of current or ex partner: No    Emotionally abused: No    Physically abused: No    Forced sexual activity: No  Other Topics Concern  . Not on file  Social History Narrative   Marital status:  Widowed; husband died suddenly (MS, pacemaker, atrial fibrillation on Coumadin; head trauma with bleed s/p emergency surgery with persistent  bleeding).  2013.  Not dating in 2019; not interested.        Children:  1 daughter; 1 stepson; 2 stepgrandchildren.  Daughter in [place]; [place] in [place].      Lives: alone; has dog and cat.      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        Tobacco:  never       Alcohol:  Rarely; holidays      Exercise:  Walking 2 miles per day.      Seatbelts:  100%      Guns:  None      ADLs:  Independent with all ADLs; drives/cooks/finances.  No assistant devices.        Advanced Directives:  Living Will; HCPOA is daughter/[name] [phone]); FULL CODE but no prolonged measures.               Objective:  BP 114/70 (BP Location: Right Arm, Patient Position: Sitting, Cuff Size: Normal)   Pulse 83   Temp 98.5 F (36.9 C) (Oral)   Resp 18   Ht 5' 2.48" (1.587 m)   Wt 103 lb 6.4 oz (46.9 kg)   SpO2 97%   BMI 18.62 kg/m   Physical Exam  Constitutional: She is oriented to person, place, and time. She appears well-developed and well-nourished. No distress.  HENT:  Head: Normocephalic and atraumatic.  Mouth/Throat: Uvula is midline, oropharynx is clear and moist and mucous membranes are normal.  Tenderness with palpation of bilateral jaw lines.   Eyes: Pupils are equal, round, and reactive to light. Conjunctivae and EOM are normal.  Neck: Normal range of motion. Neck supple. Muscular tenderness (mild, bilaterally) present. No neck rigidity. Normal range of motion present. No thyromegaly present.  Cardiovascular: Normal rate, regular rhythm, normal heart sounds and intact distal pulses.  Pulmonary/Chest: Effort normal and breath sounds normal. She has no wheezes. She has no rhonchi. She has no rales.  Musculoskeletal: Normal range of motion.       Lumbar back: She exhibits tenderness (with palation of bilateral lumbar musculature). She exhibits normal range of motion, no bony  tenderness and no swelling.       Right lower leg: She exhibits no swelling.       Left lower leg: She exhibits no swelling.  Lymphadenopathy:       Head (right side): No submental, no submandibular, no tonsillar, no preauricular, no posterior auricular and no occipital adenopathy present.       Head (left side): No submental, no submandibular,  no tonsillar, no preauricular, no posterior auricular and no occipital adenopathy present.    She has no cervical adenopathy.       Right: No supraclavicular adenopathy present.       Left: No supraclavicular adenopathy present.  Neurological: She is alert and oriented to person, place, and time. She has normal strength and normal reflexes. No sensory deficit.  Skin: Skin is warm and dry.     Psychiatric: She has a normal mood and affect.  Vitals reviewed.  EKG shows NSR with rate of 71bpm. PR and QRS intervals within normal limits. No acute changes noted from prior EKG of 11/2014.   Assessment and Plan :  1. Arthralgia, unspecified joint 2. Tick bite, initial encounter  New onset tick bite with joint pain and headache.  Has had some palpitations over the past few days.  Patient is overall well-appearing, no distress.  Physical exam benign.  Vital stable.  EKG normal.  Labs pending.  Will treat empirically with Doxy at this time.  Advised to return to clinic if symptoms worsen, do not improve, or as needed.  - B. burgdorfi antibodies - Rocky mtn spotted fvr abs pnl(IgG+IgM) - EKG 12-Lead - doxycycline (VIBRAMYCIN) 100 MG capsule; Take 1 capsule (100 mg total) by mouth 2 (two) times daily.  Dispense: 20 capsule; Refill: 0  3. Palpitations 4. Fibromyalgia 5. Hypothyroidism due to acquired atrophy of thyroid Cytomel dose was recently decreased in 12/2017.  Will repeat TSH at this time. - TSH    Benjiman Core PA-C  Primary Care at [facility] 03/06/2018 8:47 AM

## 2018-03-06 NOTE — Patient Instructions (Addendum)
We should have your labs back within one week.     While taking Doxycycline:  -Do not drink milk or take iron supplements, multivitamins, calcium supplements, antacids, laxatives within 2 hours before or after taking doxycycline. -Avoid direct exposure to sunlight or tanning beds. Doxycycline can make you sunburn more easily. Wear protective clothing and use sunscreen (SPF 30 or higher) when you are outdoors. -Antibiotic medicines can cause diarrhea, which may be a sign of a new infection. If you have diarrhea that is watery or bloody, stop taking this medicine and seek medical care.   Tick Bite Information, Adult Ticks are insects that can bite. Most ticks live in shrubs and grassy areas. They climb onto people and animals that go by. Then they bite. Some ticks carry germs that can make you sick. How can I prevent tick bites?  Use an insect repellent that has 20% or higher of the ingredients DEET, picaridin, or IR3535. Put this insect repellent on: ? Bare skin. ? The tops of your boots. ? Your pant legs. ? The ends of your sleeves.  If you use an insect repellent that has the ingredient permethrin, make sure to follow the instructions on the bottle. Treat the following: ? Clothing. ? Supplies. ? Boots. ? Tents.  Wear long sleeves, long pants, and light colors.  Tuck your pant legs into your socks.  Stay in the middle of the trail.  Try not to walk through long grass.  Before going inside your house, check your clothes, hair, and skin for ticks. Make sure to check your head, neck, armpits, waist, groin, and joint areas.  Check for ticks every day.  When you come indoors: ? Wash your clothes right away. ? Shower right away. ? Dry your clothes in a dryer on high heat for 60 minutes or more. What is the right way to remove a tick? Remove a tick from your skin as soon as possible.  To remove a tick that is crawling on your skin: ? Go outdoors and brush the tick off. ? Use  tape or a lint roller.  To remove a tick that is biting: ? Wash your hands. ? If you have latex gloves, put them on. ? Use tweezers, curved forceps, or a tick-removal tool to grasp the tick. Grasp the tick as close to your skin and as close to the tick's head as possible. ? Gently pull up until the tick lets go.  Try to keep the tick's head attached to its body.  Do not twist or jerk the tick.  Do not squeeze or crush the tick.  Do not try to remove a tick with heat, alcohol, petroleum jelly, or fingernail polish. How should I get rid of a tick? Here are some ways to get rid of a tick that is alive:  Place the tick in rubbing alcohol.  Place the tick in a bag or container you can close tightly.  Wrap the tick tightly in tape.  Flush the tick down the toilet.  Contact a doctor if:  You have symptoms of a disease, such as: ? Pain in a muscle, joint, or bone. ? Trouble walking or moving your legs. ? Numbness in your legs. ? Inability to move (paralysis). ? A red rash that makes a circle (bull's-eye rash). ? Redness and swelling where the tick bit you. ? A fever. ? Throwing up (vomiting) over and over. ? Diarrhea. ? Weight loss. ? Tender and swollen lymph glands. ? Shortness of  breath. ? Cough. ? Belly pain (abdominal pain). ? Headache. ? Being more tired than normal. ? A change in how alert (conscious) you are. ? Confusion. Get help right away if:  You cannot remove a tick.  A part of a tick breaks off and gets stuck in your skin.  You are feeling worse. Summary  Ticks may carry germs that can make you sick.  To prevent tick bites, wear long sleeves, long pants, and light colors. Use insect repellent. Follow the instructions on the bottle.  If the tick is biting, do not try to remove it with heat, alcohol, petroleum jelly, or fingernail polish.  Use tweezers, curved forceps, or a tick-removal tool to grasp the tick. Gently pull up until the tick lets go. Do  not twist or jerk the tick. Do not squeeze or crush the tick.  If you have symptoms, contact a doctor. This information is not intended to replace advice given to you by your health care provider. Make sure you discuss any questions you have with your health care provider. Document Released: 01/09/2010 Document Revised: 01/25/2017 Document Reviewed: 01/25/2017 Elsevier Interactive Patient Education  2018 [employer].    IF you received an x-ray today, you will receive an invoice from [facility]. Please contact [facility] at [phone] with questions or concerns regarding your invoice.   IF you received labwork today, you will receive an invoice from [place]. Please contact [facility] at [phone] with questions or concerns regarding your invoice.   Our billing staff will not be able to assist you with questions regarding bills from these companies.  You will be contacted with the lab results as soon as they are available. The fastest way to get your results is to activate your My Chart account. Instructions are located on the last page of this paperwork. If you have not heard from [place] regarding the results in 2 weeks, please contact this office.

## 2018-03-07 ENCOUNTER — Encounter: Payer: Self-pay | Admitting: Physician Assistant

## 2018-03-08 LAB — B. BURGDORFI ANTIBODIES: Lyme IgG/IgM Ab: <0.91 {ISR} (ref 0.00–0.90)

## 2018-03-08 LAB — TSH: TSH: 1.21 u[IU]/mL (ref 0.450–4.500)

## 2018-03-08 LAB — ROCKY MTN SPOTTED FVR ABS PNL(IGG+IGM)
RMSF IGG: NEGATIVE
RMSF IGM: 0.78 {index} (ref 0.00–0.89)

## 2018-03-12 ENCOUNTER — Telehealth: Payer: Self-pay | Admitting: Family Medicine

## 2018-03-12 NOTE — Telephone Encounter (Signed)
Spoke to pt & gave lab results.   She wanted to know why she is continuing to have the symptoms.  She is continuing the Doxy.  1st dose was Thurs after her visit with [place].  She has the following symptoms continuing: 1) low grade headache daily (24/7) 2) tension & stiffness down neck and across bilateral shoulders 3) nausea - ? From Doxy - she does take with food. 4) Bilateral knee joint - major discomfort.

## 2018-03-12 NOTE — Telephone Encounter (Signed)
Copied from CRM [id number]. Topic: Quick Communication - See Telephone Encounter >> Mar 12, 2018 12:04 PM Eston Mould B wrote: CRM for notification. See Telephone encounter for: 03/12/18.   PT is asking for  lab results    [phone] ok to leave message

## 2018-03-12 NOTE — Telephone Encounter (Signed)
Please call pt and let her know that common side effects of doxycycline can include: headache, nausea, and joints pains.    If she is still having the symptoms after 5-7 days after she has completed the antibiotic, please have her schedule with her PCP, Dr. Katrinka Blazing for follow up. Return sooner if any symptoms worsen. Thanks!

## 2018-03-12 NOTE — Telephone Encounter (Signed)
Left detailed VM advising pt.  

## 2018-03-25 ENCOUNTER — Encounter: Payer: Self-pay | Admitting: Family Medicine

## 2018-04-11 ENCOUNTER — Telehealth: Payer: Self-pay | Admitting: Family Medicine

## 2018-04-11 NOTE — Telephone Encounter (Signed)
Copied from CRM [phone]. Topic: Referral - Request >> Apr 11, 2018  6:02 PM Raquel SarnaHayes, Teresa G wrote: Pt is needing a Rheumatology referral to Dr. Pollyann SavoyShaili Deveshwar. Please call pt to let her know if this can be done asap.

## 2018-04-13 NOTE — Telephone Encounter (Signed)
Pt req referral To Deveshwar - sent to Dr. Katrinka BlazingSmith

## 2018-04-14 ENCOUNTER — Telehealth: Payer: Self-pay | Admitting: Family Medicine

## 2018-04-14 NOTE — Telephone Encounter (Signed)
Copied from CRM [phone]. Topic: Referral - Request >> Apr 11, 2018  6:02 PM Alyssa Grant, Alyssa Grant wrote: Pt is needing a Rheumatology referral to Dr. Pollyann SavoyShaili Deveshwar. Please call pt to let her know if this can be done asap. >> Apr 14, 2018  8:25 AM Alyssa Grant, Alyssa Grant wrote: Pt calling about referral she states that one of her friends that's a Doctor states that referral need to be asap because pt may have temporal arteriolitis please give her a call back at [phone]  Please advise

## 2018-04-15 ENCOUNTER — Ambulatory Visit: Payer: PPO | Admitting: Physician Assistant

## 2018-04-15 ENCOUNTER — Encounter: Payer: Self-pay | Admitting: Physician Assistant

## 2018-04-15 ENCOUNTER — Other Ambulatory Visit: Payer: Self-pay

## 2018-04-15 VITALS — BP 108/70 | HR 75 | Temp 98.0°F | Resp 16 | Ht 62.4 in | Wt 102.8 lb

## 2018-04-15 DIAGNOSIS — R51 Headache: Secondary | ICD-10-CM | POA: Diagnosis not present

## 2018-04-15 DIAGNOSIS — R519 Headache, unspecified: Secondary | ICD-10-CM

## 2018-04-15 LAB — POCT CBC
GRANULOCYTE PERCENT: 60.8 % (ref 37–80)
HEMATOCRIT: 43.3 % (ref 37.7–47.9)
Hemoglobin: 14.1 g/dL (ref 12.2–16.2)
Lymph, poc: 2.2 (ref 0.6–3.4)
MCH, POC: 30.3 pg (ref 27–31.2)
MCHC: 32.6 g/dL (ref 31.8–35.4)
MCV: 92.9 fL (ref 80–97)
MID (cbc): 0.5 (ref 0–0.9)
MPV: 8.7 fL (ref 0–99.8)
POC GRANULOCYTE: 4.2 (ref 2–6.9)
POC LYMPH %: 32.2 % (ref 10–50)
POC MID %: 7 %M (ref 0–12)
Platelet Count, POC: 209 10*3/uL (ref 142–424)
RBC: 4.66 M/uL (ref 4.04–5.48)
RDW, POC: 13.6 %
WBC: 6.9 10*3/uL (ref 4.6–10.2)

## 2018-04-15 LAB — POCT SEDIMENTATION RATE: POCT SED RATE: 40 mm/h — AB (ref 0–22)

## 2018-04-15 MED ORDER — PREDNISONE 20 MG PO TABS: 60.0000 mg | ORAL_TABLET | Freq: Every day | ORAL | 0 refills | Status: DC

## 2018-04-15 MED ORDER — PREDNISONE 20 MG PO TABS: 60.0000 mg | ORAL_TABLET | Freq: Every day | ORAL | 0 refills | Status: AC

## 2018-04-15 NOTE — Telephone Encounter (Signed)
No further action warranted; follow-up OV necessary as patient did not discuss headaches at visit with PA Barnett AbuWiseman on 02/2018.

## 2018-04-15 NOTE — Telephone Encounter (Signed)
Pt is being seen in the office today 04/15/2018 by Sheldon SilvanBrittany Wismen for referral request.

## 2018-04-15 NOTE — Patient Instructions (Addendum)
We have placed a stat referral to vascular surgery and they should contact you within 24-48 hours. In the meantime, start prednisone. We are going to treat your underlying inflammation with oral prednisone. Prednisone is a steroid and can cause side effects such as headache, irritability, nausea, vomiting, increased heart rate, increased blood pressure, increased blood sugar, appetite changes, and insomnia. Please take tablets in the morning with a full meal to help decrease the chances of these side effects.     Temporal Arteritis Temporal arteritis, also called giant cell arteritis, is a condition that causes arteries to become swollen (inflamed). It usually affects arteries in your head and face, but arteries in any part of the body can become inflamed. Temporal arteritis can cause serious problems, such as bone loss, diabetes, and blindness. What are the causes? The cause is unknown. What increases the risk?  Being older than 50.  Being a woman.  Being Caucasian.  Being of [place], [place], [place], [place], or [place] ancestry.  Having polymyalgia rheumatica (PMR). What are the signs or symptoms? Some people with temporal arteritis have just one symptom, while other have several symptoms. Most signs and symptoms are related to the head and face. Signs and symptoms may include:  Hard or swollen temples (common). Your temples are the flattened area on either side of your forehead. If your temples are swollen, it may hurt to touch them.  Pain when combing your hair or when laying your head down.  Pain in the jaw when chewing.  Pain in the throat or tongue.  Problems with your vision, such as sudden loss of vision in one eye, or seeing double.  Fever.  Fatigue.  A dry cough.  Pain in the hips and shoulders.  Pain in the arms during exercise.  Depression.  Weight loss.  How is this diagnosed? Your health care provider will ask about your symptoms and do a physical  exam. He or she may also perform an eye exam and tests, such as:  A complete blood count.  An erythrocyte sedimentation rate test, also called the sed rate test.  A C-reactive protein (CRP) test.  A tissue sample (biopsy) test.  How is this treated? Temporal arteritis is treated with a type of medicine called a corticosteroid. Vision problems may be treated with additional medicines. You will need to see your health care provider while you are being treated. During follow-up visits, your health care provider will check for problems by:  Performing blood tests and bone density tests.  Checking your blood pressure and blood sugar.  Follow these instructions at home:  Take medicines only as directed by your health care provider.  Take any vitamins or supplements that your health care provider suggests. These may include vitamin D and calcium, which help keep your bones from becoming weak.  Exercise. Talk with your health care provider about what exercises are okay for you to do. Usually exercises that increase your heart rate (aerobic exercise), such as walking, are recommended. Aerobic exercise helps control your blood pressure and prevent bone loss.  Follow a healthy diet. Include healthy sources of protein, fruits, vegetables, and whole grains in your diet. Following a healthy diet helps prevent bone damage and diabetes. Contact a health care provider if:  Your symptoms get worse.  Your fever, fatigue, headache, weight loss, or pain in your jaw gets worse.  You develop signs of infection, such as fever, swelling, redness, warmth, and tenderness. Get help right away if:  Your vision  gets worse.  Your pain does not go away, even after you take pain medicine.  You have chest pain.  You have trouble breathing.  One side of your face or body suddenly becomes weak or numb. This information is not intended to replace advice given to you by your health care provider. Make sure  you discuss any questions you have with your health care provider. Document Released: 08/12/2009 Document Revised: 06/14/2016 Document Reviewed: 12/09/2013 Elsevier Interactive Patient Education  2018 [employer].    IF you received an x-ray today, you will receive an invoice from [facility]. Please contact [facility] at [phone] with questions or concerns regarding your invoice.   IF you received labwork today, you will receive an invoice from [place]. Please contact [facility] at [phone] with questions or concerns regarding your invoice.   Our billing staff will not be able to assist you with questions regarding bills from these companies.  You will be contacted with the lab results as soon as they are available. The fastest way to get your results is to activate your My Chart account. Instructions are located on the last page of this paperwork. If you have not heard from [place] regarding the results in 2 weeks, please contact this office.

## 2018-04-15 NOTE — Progress Notes (Signed)
Alyssa Grant  MRN: [id number] DOB: 24-Sep-1951  Subjective:  Alyssa Grant is a 67 y.o. female with PMH of hypothyroidism and fibromyalgia. seen in office today for a chief complaint of headache.  Onset: 1.5 months  Location: [place]  Quality: intensely dull headache that does not stop Frequency: all day Precipitating factors: cannot identify     Prior treatment: No relief with OTC medicaiton.   Placing hands at base of head causes relief.  Has never had headaches like this before. Drinking at least a liter of water a day.  Sleeps at least 7 hours.  Limited screen time. Cup of coffee in the morning.  Has seasonal allergy headaches. Takes nasal spray.  Associated Symptoms Nausea/vomiting: no  Photophobia/phonophobia: no  Tearing of eyes: no  Sinus pain/pressure: no  Family hx migraine: yes, daughter  Personal stressors: no    Red Flags Fever: no  Neck pain/stiffness: yes, neck tightness  Vision/speech/swallow/hearing difficulty: Has noticed lights flickering a few times over the past month. No vision. Focal weakness/numbness: no  Altered mental status: no  Trauma: no  New type of headache: yes  Anticoagulant use: no  H/o cancer/HIV/Pregnancy: no  Jaw claudication: no   Review of Systems  Constitutional: Positive for fatigue. Negative for chills, fever and unexpected weight change.  Cardiovascular: Negative for chest pain and palpitations.  Musculoskeletal: Positive for arthralgias ("always have joint aches"). Negative for myalgias and neck stiffness.  Neurological: Negative for dizziness, light-headedness and numbness.     Patient Active Problem List   Diagnosis Date Noted  . Fibromyalgia 01/02/2017  . Osteoporosis 12/30/2015  . Underweight 12/28/2014  . Thyroid activity decreased 12/28/2014    Current Outpatient Medications on File Prior to Visit  Medication Sig Dispense Refill  . doxycycline (VIBRAMYCIN) 100 MG capsule Take 1 capsule (100 mg  total) by mouth 2 (two) times daily. 20 capsule 0  . ferrous sulfate 325 (65 FE) MG tablet Take 325 mg by mouth as needed.     Marland Kitchen levothyroxine (SYNTHROID, LEVOTHROID) 75 MCG tablet Take 1 tablet (75 mcg total) by mouth daily before breakfast. 90 tablet 3  . liothyronine (CYTOMEL) 25 MCG tablet Take 1 tablet (25 mcg total) by mouth daily. 90 tablet 3  . Multiple Vitamin (MULTIVITAMIN) tablet Take 1 tablet by mouth daily.    Marland Kitchen OVER THE COUNTER MEDICATION OTC Fish Oil 320 mg EPA, 200 mg DHA taking daily    . OVER THE COUNTER MEDICATION Magnesium Malate 1250 mg taking daily    . OVER THE COUNTER MEDICATION Vitamin D 2000 iu taking daily    . OVER THE COUNTER MEDICATION Biotin 5000 mcg taking daily    . vitamin E 400 UNIT capsule Take 400 Units by mouth daily.     No current facility-administered medications on file prior to visit.     Allergies  Allergen Reactions  . Levaquin [Levofloxacin]   . Penicillins     Rash/swelling  . Sulfa Antibiotics     rash      Social History   Socioeconomic History  . Marital status: Widowed    Spouse name: Widowed  . Number of children: 1  . Years of education: Not on file  . Highest education level: Not on file  Occupational History  . 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  Social Needs  . Financial resource strain: Not hard at all  . Food insecurity:    Worry: Never true  Inability: Never true  . Transportation needs:    Medical: No    Non-medical: No  Tobacco Use  . Smoking status: Never Smoker  . Smokeless tobacco: Never Used  Substance and Sexual Activity  . Alcohol use: No    Alcohol/week: 0.0 oz  . Drug use: No  . Sexual activity: Not Currently    Partners: Male    Birth control/protection: Post-menopausal  Lifestyle  . Physical activity:    Days per week: 7 days    Minutes per session: 40 min  . Stress: Not at all  Relationships  . Social connections:    Talks on phone: More than  three times a week    Gets together: Once a week    Attends religious service: More than 4 times per year    Active member of club or organization: Yes    Attends meetings of clubs or organizations: More than 4 times per year    Relationship status: Widowed  . Intimate partner violence:    Fear of current or ex partner: No    Emotionally abused: No    Physically abused: No    Forced sexual activity: No  Other Topics Concern  . Not on file  Social History Narrative   Marital status:  Widowed; husband died suddenly (MS, pacemaker, atrial fibrillation on Coumadin; head trauma with bleed s/p emergency surgery with persistent bleeding).  2013.  Not dating in 2019; not interested.        Children:  1 daughter; 1 stepson; 2 stepgrandchildren.  Daughter in [place]; [place] in [place].      Lives: alone; has dog and cat.      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        Tobacco:  never       Alcohol:  Rarely; holidays      Exercise:  Walking 2 miles per day.      Seatbelts:  100%      Guns:  None      ADLs:  Independent with all ADLs; drives/cooks/finances.  No assistant devices.        Advanced Directives:  Living Will; HCPOA is daughter/Amber Sherlean Foot [phone]); FULL CODE but no prolonged measures.               Objective:  BP 108/70   Pulse 75   Temp 98 F (36.7 C) (Oral)   Resp 16   Ht 5' 2.4" (1.585 m)   Wt 102 lb 12.8 oz (46.6 kg)   SpO2 95%   BMI 18.56 kg/m   Physical Exam  Constitutional: She is oriented to person, place, and time. She appears well-developed and well-nourished. No distress.  HENT:  Head: Normocephalic and atraumatic.  Right Ear: External ear and ear canal normal. Tympanic membrane is not erythematous and not bulging. A middle ear effusion is present.  Left Ear: External ear and ear canal normal. Tympanic membrane is not erythematous and  not bulging. A middle ear effusion is present.  Mouth/Throat: Uvula is midline, oropharynx is clear and moist and mucous membranes are normal. No tonsillar exudate.  Eyes: Pupils are equal, round, and reactive to light. Conjunctivae and EOM are normal.  Fundoscopic exam:      The right eye shows no hemorrhage and no papilledema. The right eye shows red reflex.       The left eye shows no  hemorrhage and no papilledema. The left eye shows red reflex.  Visual fields intact bilaterally.   Neck: Normal range of motion and full passive range of motion without pain. No Brudzinski's sign and no Kernig's sign noted.  Cardiovascular: Normal rate, regular rhythm and normal heart sounds.  Bilateral temporal vasculature is prominent in appearance. Right temporal vasculature is easily compressible. Left temporal vasculature is thickened and and hard to palpation with mild TTP. Temporal artery pulses are palpable b/l.    Pulmonary/Chest: Effort normal.  Neurological: She is alert and oriented to person, place, and time. She has normal strength and normal reflexes. No cranial nerve deficit or sensory deficit. She displays a negative Romberg sign. Gait normal.  Skin: Skin is warm and dry.  Psychiatric: She has a normal mood and affect.  Vitals reviewed.    Results for orders placed or performed in visit on 04/15/18 (from the past 24 hour(s))  POCT SEDIMENTATION RATE     Status: Abnormal   Collection Time: 04/15/18  4:17 PM  Result Value Ref Range   POCT SED RATE 40 (A) 0 - 22 mm/hr  POCT CBC     Status: Normal   Collection Time: 04/15/18  4:49 PM  Result Value Ref Range   WBC 6.9 4.6 - 10.2 K/uL   Lymph, poc 2.2 0.6 - 3.4   POC LYMPH PERCENT 32.2 10 - 50 %L   MID (cbc) 0.5 0 - 0.9   POC MID % 7.0 0 - 12 %M   POC Granulocyte 4.2 2 - 6.9   Granulocyte percent 60.8 37 - 80 %G   RBC 4.66 4.04 - 5.48 M/uL   Hemoglobin 14.1 12.2 - 16.2 g/dL   HCT, POC 43.3 37.7 - 47.9 %   MCV 92.9 80 - 97 fL   MCH,  POC 30.3 27 - 31.2 pg   MCHC 32.6 31.8 - 35.4 g/dL   RDW, POC 13.6 %   Platelet Count, POC 209 142 - 424 K/uL   MPV 8.7 0 - 99.8 fL    Visual Acuity Screening   Right eye Left eye Both eyes  Without correction:     With correction: 20/20 20/25 20/25         Assessment and Plan :   1. Intractable headache, unspecified chronicity pattern, unspecified headache type Hx and PE findings concerning for giant cell arteritis. Chronicity of headache makes this dx perhaps a little less likely however with sx set, PE findings, and elevated ESR, cannot rule out GCA at this time. Fortunately, no vision loss noted. Started on oral pred 28m daily and urgent referral to vascular surgery.  - POCT CBC - POCT SEDIMENTATION RATE - C-reactive protein - Ambulatory referral to Vascular Surgery - predniSONE (DELTASONE) 20 MG tablet; Take 3 tablets (60 mg total) by mouth daily with breakfast for 5 days.  Dispense: 15 tablet; Refill: 0  2. Temporal pain - Ambulatory referral to Vascular Surgery   BTenna DelainePA-C  [facility]6/18/2019 4:56 PM

## 2018-04-15 NOTE — Telephone Encounter (Signed)
Last OV with PA Barnett AbuWiseman; thus, message most appropriate to PA [place].  Appointment scheduled for PA [place] today.

## 2018-04-15 NOTE — Telephone Encounter (Signed)
Pt following up on request for urgent referral.  Pt states she saw [place] May 9 and complained of the symptoms to her. (headaches)  So if Dr Katrinka BlazingSmith unable to do the referral, can [place] do the referral? Pt needs to know something by lunch. This is urgent.

## 2018-04-16 ENCOUNTER — Telehealth: Payer: Self-pay | Admitting: Physician Assistant

## 2018-04-16 LAB — C-REACTIVE PROTEIN: CRP: 3 mg/L (ref 0–10)

## 2018-04-16 NOTE — Telephone Encounter (Signed)
Contacted by Dr. Lazarus SalinesWolicki. He will see pt in office for temporal artery bx this week. Pt does not need referral. We will cancel vascular surgery referral. Called pt. No answer. Left voicemail explaining this. Advised her to contact us if she has any questions.

## 2018-04-16 NOTE — Telephone Encounter (Signed)
Copied from CRM [id number]. Topic: Quick Communication - See Telephone Encounter >> Apr 16, 2018 11:47 AM Cipriano BunkerLambe, Annette S wrote: CRM for notification. See Telephone encounter for: 04/16/18.  [phone] Dr. Lazarus SalinesWolicki ENT  called needing to speak to [place] Wiseman regarding pt.  Please Have [place] call

## 2018-04-16 NOTE — Telephone Encounter (Signed)
Contacted Dr. Raye SorrowWolicki's office. No answer. Left message to contact me back.

## 2018-04-16 NOTE — Telephone Encounter (Signed)
Alyssa Grant from [facility] [place] called stating pt has an appt scheduled for 05/21/18 as this is the soonest they can get her in. I told her not to notify pt of this yet, to see if we want to hold on to this appt for the future. I told her we were still trying to get pt seen this week, but provider may want that later appt for pt to follow up with. Do we want to keep this appt for future date even if pt sees someone else before this, or should I call to cancel? Thanks!

## 2018-04-22 ENCOUNTER — Telehealth: Payer: Self-pay | Admitting: Physician Assistant

## 2018-04-22 DIAGNOSIS — M316 Other giant cell arteritis: Secondary | ICD-10-CM | POA: Diagnosis not present

## 2018-04-22 NOTE — Telephone Encounter (Signed)
Copied from CRM [id number]. Topic: Inquiry >> Apr 22, 2018  9:00 AM Alyssa Grant, Alyssa Grant wrote: Reason for CRM: Patient called wanting to let Alyssa Grant know that the Biopsy will be at 11:30am today. Patient wants Alyssa Grant to call her once she has the results of the biopsy. Please call the patient at  [phone].       Thank You!!!

## 2018-04-23 ENCOUNTER — Telehealth: Payer: Self-pay | Admitting: Family Medicine

## 2018-04-23 ENCOUNTER — Telehealth: Payer: Self-pay | Admitting: Physician Assistant

## 2018-04-23 ENCOUNTER — Telehealth: Payer: Self-pay

## 2018-04-23 NOTE — Telephone Encounter (Unsigned)
Copied from CRM [id number]. Topic: Quick Communication - See Telephone Encounter >> Apr 23, 2018  7:55 AM Waymon AmatoBurton, Donna F wrote: The biopsy was done yesterday and results may not be back until Tuesday of next week and is gong to need a refill on prednisone this is something that she can not go off of and she only has a few left   Best number [phone]  [facility]

## 2018-04-23 NOTE — Telephone Encounter (Signed)
Lm on vm for Amy [facility] to return my call re: pt biopsy results.  Amy returned my call advises pt had biopy on 04/22/18 and is not expecting results back until approx. Friday 04/24/18 or Monday 04/28/18. Advised to send results to our office attn: Jacoby Zanni to fax # [phone].  Amy agreeable and will send to our office. Dgaddy, CMA

## 2018-04-23 NOTE — Telephone Encounter (Signed)
Pt requesting refill on prednisone 20 mg # 15 with no refills approved per [facility].   Dgaddy, CMA

## 2018-04-24 NOTE — Telephone Encounter (Signed)
Pt. Is requesting she will need more Prednisone next week after her 5 day supply runs out and is "just getting ahead of this."

## 2018-04-24 NOTE — Telephone Encounter (Signed)
Pt req refill of prednisone next week.  Sent to Dr. Katrinka BlazingSmith, please advise

## 2018-04-24 NOTE — Telephone Encounter (Unsigned)
Copied from CRM [id number]. Topic: Quick Communication - See Telephone Encounter >> Apr 23, 2018  7:55 AM Waymon AmatoBurton, Donna F wrote: The biopsy was done yesterday and results may not be back until Tuesday of next week and is gong to need a refill on prednisone this is something that she can not go off of and she only has a few left   Best number [phone]  [facility] >> Apr 24, 2018  9:04 AM Percival SpanishKennedy, Cheryl W wrote:   Pt call to say the amount of Prednisome that was called in will not be enough. She said she will need to be on the med until they decide on her course of treatment. Would like a call back

## 2018-04-25 ENCOUNTER — Telehealth: Payer: Self-pay | Admitting: Family Medicine

## 2018-04-25 ENCOUNTER — Telehealth: Payer: Self-pay

## 2018-04-25 ENCOUNTER — Telehealth: Payer: Self-pay | Admitting: Physician Assistant

## 2018-04-25 DIAGNOSIS — M316 Other giant cell arteritis: Secondary | ICD-10-CM

## 2018-04-25 MED ORDER — PREDNISONE 20 MG PO TABS: 40.0000 mg | ORAL_TABLET | Freq: Every day | ORAL | 0 refills | Status: DC

## 2018-04-25 NOTE — Telephone Encounter (Signed)
Copied from CRM [id number]. Topic: Quick Communication - See Telephone Encounter >> Apr 25, 2018 11:39 AM Oneal GroutSebastian, Jennifer S wrote: CRM for notification. See Telephone encounter for: 04/25/18. Bx came back as giant cell arteritis, does [place] need to see patient? Will need further prednisone, last dose will be Tuesday.

## 2018-04-25 NOTE — Telephone Encounter (Signed)
Spoke with pt. She is okay with [facility] for an initial visit to get her in sooner with Rheumatology. Pt did say she is a pt of Dr. Fatima Sangereveshwar's and would like to follow up with her after this initial visit. I told the pt I would call to Dr. Fatima Sangereveshwar's office and ask if this would be possible, and if so, what we need to send them for this. I had to leave a vm for them to call me back. I also told pt I will call [facility] on Monday to follow up with them about getting an appt.Pt said we ordered a biopsy that will need to be sent to them with referral. Pt is scheduled to see [place] on 04/28/18 at 8am.

## 2018-04-25 NOTE — Addendum Note (Signed)
Addended by: Benjiman CoreWISEMAN, Sumire Halbleib D on: 04/25/2018 02:16 PM   Modules accepted: Orders

## 2018-04-25 NOTE — Telephone Encounter (Signed)
Per CRM, Dr. Lazarus Salineswolicki requests referral asap to Dr. Antony Odeaevehswar Biopsy returned as giant cell arteritis  Other questions: does pt need more prednisone as her last dose is Tuesday Do you need to see her?  Sent to [facility].

## 2018-04-25 NOTE — Telephone Encounter (Signed)
[facility] is unable to see pt urgently as provider is out of office next week, and it takes 1-2 weeks for her to review pt's chart before seeing pt. I have switched pt's referral to [facility]. They can be reached at [phone].

## 2018-04-25 NOTE — Telephone Encounter (Signed)
Orders Placed This Encounter  Procedures  . Ambulatory referral to Rheumatology    Referral Priority:   Urgent    Referral Type:   Consultation    Referral Reason:   Specialty Services Required    Requested Specialty:   Rheumatology    Number of Visits Requested:   1   Rose, could you please contact patient and tell her I would like to see her on Monday before her last dose of prednisone on Tuesday.  I have placed a referral for urgent.  Pattricia Bossnnie, could be please make sure this gets done as soon as possible.  her results returned with giant cell arteritis.  Please let them know.

## 2018-04-25 NOTE — Telephone Encounter (Signed)
Copied from CRM [id number]. Topic: Referral - Request >> Apr 25, 2018 11:35 AM Oneal GroutSebastian, Jennifer S wrote: Reason for CRM: Requesting referral to Dr Antony Odeaevehswar for dx of biopsy giant cell arteritis, referral needs to be placed asap per Dr Lazarus SalinesWolicki

## 2018-04-25 NOTE — Telephone Encounter (Signed)
I sent an additional supply of Prednisone in for patient to take after she completes the rx sent in on 04/23/18 for three tablets daily; she should then take two Prednisone tablets daily for five days while awaiting biopsy results. Patient needs to contact PA Barnett AbuWiseman or myself for further instructions on Prednisone once biopsy results return.   Please advise patient.

## 2018-04-28 ENCOUNTER — Other Ambulatory Visit: Payer: Self-pay

## 2018-04-28 ENCOUNTER — Encounter: Payer: Self-pay | Admitting: Physician Assistant

## 2018-04-28 ENCOUNTER — Ambulatory Visit: Payer: PPO | Admitting: Physician Assistant

## 2018-04-28 VITALS — BP 102/64 | HR 93 | Temp 99.2°F | Resp 18 | Ht 61.42 in | Wt 103.8 lb

## 2018-04-28 DIAGNOSIS — M316 Other giant cell arteritis: Secondary | ICD-10-CM | POA: Diagnosis not present

## 2018-04-28 MED ORDER — PREDNISONE 50 MG PO TABS: 50.0000 mg | ORAL_TABLET | Freq: Every day | ORAL | 0 refills | Status: DC

## 2018-04-28 NOTE — Patient Instructions (Addendum)
It was a pleasure seeing you today.   I have given you refills for prednisone 50 mg tablets to take daily.  Please take with food.  I have given you 14 tablets, which should hold you over until you are evaluated by rheumatology.  If you do not see rheumatology before 2 weeks, please return the office  before running out so we can obtain some labs and give additional refills.   Thank you for letting me participate in your health and well being.     IF you received an x-ray today, you will receive an invoice from [facility]. Please contact [facility] at [phone] with questions or concerns regarding your invoice.   IF you received labwork today, you will receive an invoice from [facility]. Please contact [facility] at [phone] with questions or concerns regarding your invoice.   Our billing staff will not be able to assist you with questions regarding bills from these companies.  You will be contacted with the lab results as soon as they are available. The fastest way to get your results is to activate your My Chart account. Instructions are located on the last page of this paperwork. If you have not heard from us regarding the results in 2 weeks, please contact this office.

## 2018-04-28 NOTE — Telephone Encounter (Signed)
Spoke with pt and she is going to stick with [facility]. Pt very appreciative. Thanks!

## 2018-04-28 NOTE — Telephone Encounter (Signed)
[facility] called and have scheduled pt for 04/30/18 at 1pm to see Dr. Deanne CofferAryal. They said they would call pt to let her know of this appt. Adrienne from Dr. Fatima Sangereveshwar's office also called and let me know that pt would be a new patient with them and they are unsure if they would be able to follow pt for rheum after she is initially seen at [facility], because they typically like the doctor that initially sees the pt to follow them. She said we can try to send a referral to them after she is initially seen with [facility], but they may not be able to take pt. It would be at least 3 weeks before pt can be seen with Dr. Corliss Skainseveshwar initially, and both [facility] and [facility] said pt does not need to wait this long to be seen. I left pt a voicemail with this information, including her appt for 7/3 and relayed [facility]'s message, but that if she would like to try to get in with her after her initial visit, we can try to send a referral to them.

## 2018-04-28 NOTE — Progress Notes (Signed)
Alyssa Grant  MRN: [id number] DOB: 1951-02-26  Subjective:  Alyssa Grant is a 67 y.o. female seen in office today for a chief complaint of follow up on GCA. Initially evaluated by me in office for this issue on 04/15/2018. Had temporal artery biopsy, which returned positive for GCA.  Has been on prednisone 60 mg daily since visit. Headache has improved. Reports tolerating the medication. Takes with a glass of milk, does get a little nauseous after taking medication. Denies irritability, vomiting, increased heart rate, increased blood pressure, increased blood sugar, appetite changes, and insomnia. Vitamin D/calcium intake: Increased in diet and taking OTC supplements.  Has not heard from rheum referral yet.  No other questions or concerns today.  Review of Systems  Constitutional: Negative for chills, diaphoresis and fever.  Eyes: Negative for pain and visual disturbance.      Patient Active Problem List   Diagnosis Date Noted  . Fibromyalgia 01/02/2017  . Osteoporosis 12/30/2015  . Underweight 12/28/2014  . Thyroid activity decreased 12/28/2014    Current Outpatient Medications on File Prior to Visit  Medication Sig Dispense Refill  . doxycycline (VIBRAMYCIN) 100 MG capsule Take 1 capsule (100 mg total) by mouth 2 (two) times daily. 20 capsule 0  . ferrous sulfate 325 (65 FE) MG tablet Take 325 mg by mouth as needed.     Marland Kitchen levothyroxine (SYNTHROID, LEVOTHROID) 75 MCG tablet Take 1 tablet (75 mcg total) by mouth daily before breakfast. 90 tablet 3  . liothyronine (CYTOMEL) 25 MCG tablet Take 1 tablet (25 mcg total) by mouth daily. 90 tablet 3  . Multiple Vitamin (MULTIVITAMIN) tablet Take 1 tablet by mouth daily.    Marland Kitchen OVER THE COUNTER MEDICATION OTC Fish Oil 320 mg EPA, 200 mg DHA taking daily    . OVER THE COUNTER MEDICATION Magnesium Malate 1250 mg taking daily    . OVER THE COUNTER MEDICATION Vitamin D 2000 iu taking daily    . OVER THE COUNTER MEDICATION Biotin 5000 mcg  taking daily    . predniSONE (DELTASONE) 20 MG tablet Take 2 tablets (40 mg total) by mouth daily with breakfast. 10 tablet 0  . vitamin E 400 UNIT capsule Take 400 Units by mouth daily.     No current facility-administered medications on file prior to visit.     Allergies  Allergen Reactions  . Levaquin [Levofloxacin]   . Penicillins     Rash/swelling  . Sulfa Antibiotics     rash     Objective:  Pulse 93   Temp 99.2 F (37.3 C) (Oral)   Resp 18   Ht 5' 1.42" (1.56 m)   Wt 103 lb 12.8 oz (47.1 kg)   SpO2 95%   BMI 19.35 kg/m   Physical Exam  Constitutional: She is oriented to person, place, and time. She appears well-developed and well-nourished.  HENT:  Head: Normocephalic and atraumatic.  Eyes: Conjunctivae are normal.  Neck: Normal range of motion.  Pulmonary/Chest: Effort normal.  Neurological: She is alert and oriented to person, place, and time.  Skin: Skin is warm and dry. Ecchymosis (on left side of face overlying well healed laceration with sutures intact ) noted.  Psychiatric: She has a normal mood and affect.  Vitals reviewed.   Assessment and Plan :  1. Giant cell arteritis (HCC) We have not received formal bx results from Dr. Raye Sorrow office yet. Awaiting fax.  [facility].  Will decrease prednisone from 60  mg to 50 mg daily.  Plan to be on this dose for at least the next 2 weeks.  Hopefully she will get in with rheumatology before 2 weeks.  If not, patient to return to office before she runs out of prednisone.  Will obtain labs at that time. - predniSONE (DELTASONE) 50 MG tablet; Take 1 tablet (50 mg total) by mouth daily with breakfast.  Dispense: 14 tablet; Refill: 0    Benjiman CoreBrittany Kumiko Fishman PA-C  [facility] at [facility]  [facility] 04/28/2018 8:14 AM

## 2018-04-30 DIAGNOSIS — Z13820 Encounter for screening for osteoporosis: Secondary | ICD-10-CM | POA: Diagnosis not present

## 2018-04-30 DIAGNOSIS — R700 Elevated erythrocyte sedimentation rate: Secondary | ICD-10-CM | POA: Diagnosis not present

## 2018-04-30 DIAGNOSIS — R51 Headache: Secondary | ICD-10-CM | POA: Diagnosis not present

## 2018-04-30 DIAGNOSIS — M316 Other giant cell arteritis: Secondary | ICD-10-CM | POA: Diagnosis not present

## 2018-04-30 DIAGNOSIS — M797 Fibromyalgia: Secondary | ICD-10-CM | POA: Diagnosis not present

## 2018-04-30 DIAGNOSIS — Z8679 Personal history of other diseases of the circulatory system: Secondary | ICD-10-CM | POA: Diagnosis not present

## 2018-04-30 DIAGNOSIS — Z79899 Other long term (current) drug therapy: Secondary | ICD-10-CM | POA: Diagnosis not present

## 2018-05-21 ENCOUNTER — Encounter: Payer: PPO | Admitting: Vascular Surgery

## 2018-05-29 ENCOUNTER — Telehealth: Payer: Self-pay

## 2018-05-29 DIAGNOSIS — K219 Gastro-esophageal reflux disease without esophagitis: Secondary | ICD-10-CM | POA: Diagnosis not present

## 2018-05-29 DIAGNOSIS — R51 Headache: Secondary | ICD-10-CM | POA: Diagnosis not present

## 2018-05-29 DIAGNOSIS — M797 Fibromyalgia: Secondary | ICD-10-CM | POA: Diagnosis not present

## 2018-05-29 DIAGNOSIS — M810 Age-related osteoporosis without current pathological fracture: Secondary | ICD-10-CM | POA: Diagnosis not present

## 2018-05-29 DIAGNOSIS — M316 Other giant cell arteritis: Secondary | ICD-10-CM | POA: Diagnosis not present

## 2018-05-29 DIAGNOSIS — Z7952 Long term (current) use of systemic steroids: Secondary | ICD-10-CM | POA: Diagnosis not present

## 2018-05-29 DIAGNOSIS — R700 Elevated erythrocyte sedimentation rate: Secondary | ICD-10-CM | POA: Diagnosis not present

## 2018-05-29 DIAGNOSIS — G4700 Insomnia, unspecified: Secondary | ICD-10-CM | POA: Diagnosis not present

## 2018-05-29 LAB — POCT ERYTHROCYTE SEDIMENTATION RATE, NON-AUTOMATED: Sed Rate: 6

## 2018-05-29 LAB — BASIC METABOLIC PANEL
BUN: 17 (ref 4–21)
Creatinine: 0.8 (ref 0.5–1.1)
Glucose: 80
Potassium: 4.9 (ref 3.4–5.3)

## 2018-05-29 LAB — HEPATIC FUNCTION PANEL
ALT: 21 (ref 7–35)
AST: 20 (ref 13–35)
Alkaline Phosphatase: 56 (ref 25–125)
BILIRUBIN, TOTAL: 0.5

## 2018-05-29 NOTE — Telephone Encounter (Signed)
Copied from CRM [id number]. Topic: General - Other >> May 29, 2018 12:12 PM Tamela OddiHarris, Brenda J wrote: Reason for CRM: Joy with [facility] called to request a copy of patient's bone density test.  Fax #[phone] and CB# [phone].  [facility] - follow up with phone call to Joy advising it was faxed successfully.

## 2018-06-09 ENCOUNTER — Encounter: Payer: Self-pay | Admitting: Family Medicine

## 2018-06-26 DIAGNOSIS — M316 Other giant cell arteritis: Secondary | ICD-10-CM | POA: Diagnosis not present

## 2018-06-26 DIAGNOSIS — M797 Fibromyalgia: Secondary | ICD-10-CM | POA: Diagnosis not present

## 2018-06-26 DIAGNOSIS — Z7952 Long term (current) use of systemic steroids: Secondary | ICD-10-CM | POA: Diagnosis not present

## 2018-06-26 DIAGNOSIS — R21 Rash and other nonspecific skin eruption: Secondary | ICD-10-CM | POA: Diagnosis not present

## 2018-06-26 DIAGNOSIS — K219 Gastro-esophageal reflux disease without esophagitis: Secondary | ICD-10-CM | POA: Diagnosis not present

## 2018-06-26 DIAGNOSIS — M810 Age-related osteoporosis without current pathological fracture: Secondary | ICD-10-CM | POA: Diagnosis not present

## 2018-06-26 DIAGNOSIS — R51 Headache: Secondary | ICD-10-CM | POA: Diagnosis not present

## 2018-06-26 DIAGNOSIS — R700 Elevated erythrocyte sedimentation rate: Secondary | ICD-10-CM | POA: Diagnosis not present

## 2018-08-07 DIAGNOSIS — K219 Gastro-esophageal reflux disease without esophagitis: Secondary | ICD-10-CM | POA: Diagnosis not present

## 2018-08-07 DIAGNOSIS — M316 Other giant cell arteritis: Secondary | ICD-10-CM | POA: Diagnosis not present

## 2018-08-07 DIAGNOSIS — M810 Age-related osteoporosis without current pathological fracture: Secondary | ICD-10-CM | POA: Diagnosis not present

## 2018-08-07 DIAGNOSIS — Z7952 Long term (current) use of systemic steroids: Secondary | ICD-10-CM | POA: Diagnosis not present

## 2018-08-07 DIAGNOSIS — R700 Elevated erythrocyte sedimentation rate: Secondary | ICD-10-CM | POA: Diagnosis not present

## 2018-08-07 DIAGNOSIS — R51 Headache: Secondary | ICD-10-CM | POA: Diagnosis not present

## 2018-08-07 DIAGNOSIS — M797 Fibromyalgia: Secondary | ICD-10-CM | POA: Diagnosis not present

## 2018-10-06 DIAGNOSIS — H35373 Puckering of macula, bilateral: Secondary | ICD-10-CM | POA: Diagnosis not present

## 2018-10-06 DIAGNOSIS — H532 Diplopia: Secondary | ICD-10-CM | POA: Diagnosis not present

## 2018-10-06 DIAGNOSIS — H25813 Combined forms of age-related cataract, bilateral: Secondary | ICD-10-CM | POA: Diagnosis not present

## 2018-10-06 DIAGNOSIS — H52203 Unspecified astigmatism, bilateral: Secondary | ICD-10-CM | POA: Diagnosis not present

## 2018-10-07 DIAGNOSIS — R700 Elevated erythrocyte sedimentation rate: Secondary | ICD-10-CM | POA: Diagnosis not present

## 2018-10-07 DIAGNOSIS — M810 Age-related osteoporosis without current pathological fracture: Secondary | ICD-10-CM | POA: Diagnosis not present

## 2018-10-07 DIAGNOSIS — M316 Other giant cell arteritis: Secondary | ICD-10-CM | POA: Diagnosis not present

## 2018-10-07 DIAGNOSIS — Z7952 Long term (current) use of systemic steroids: Secondary | ICD-10-CM | POA: Diagnosis not present

## 2018-10-07 DIAGNOSIS — K219 Gastro-esophageal reflux disease without esophagitis: Secondary | ICD-10-CM | POA: Diagnosis not present

## 2018-10-07 DIAGNOSIS — M797 Fibromyalgia: Secondary | ICD-10-CM | POA: Diagnosis not present

## 2018-10-07 DIAGNOSIS — R51 Headache: Secondary | ICD-10-CM | POA: Diagnosis not present

## 2018-10-20 ENCOUNTER — Encounter: Payer: Self-pay | Admitting: Family Medicine

## 2018-11-26 DIAGNOSIS — H04123 Dry eye syndrome of bilateral lacrimal glands: Secondary | ICD-10-CM | POA: Diagnosis not present

## 2018-11-26 DIAGNOSIS — H532 Diplopia: Secondary | ICD-10-CM | POA: Diagnosis not present

## 2018-11-26 DIAGNOSIS — H25813 Combined forms of age-related cataract, bilateral: Secondary | ICD-10-CM | POA: Diagnosis not present

## 2018-11-26 DIAGNOSIS — H35373 Puckering of macula, bilateral: Secondary | ICD-10-CM | POA: Diagnosis not present

## 2018-12-03 DIAGNOSIS — H43811 Vitreous degeneration, right eye: Secondary | ICD-10-CM | POA: Diagnosis not present

## 2018-12-03 DIAGNOSIS — H35342 Macular cyst, hole, or pseudohole, left eye: Secondary | ICD-10-CM | POA: Diagnosis not present

## 2018-12-03 DIAGNOSIS — H35373 Puckering of macula, bilateral: Secondary | ICD-10-CM | POA: Diagnosis not present

## 2018-12-03 DIAGNOSIS — H43391 Other vitreous opacities, right eye: Secondary | ICD-10-CM | POA: Diagnosis not present

## 2018-12-04 ENCOUNTER — Ambulatory Visit (INDEPENDENT_AMBULATORY_CARE_PROVIDER_SITE_OTHER): Payer: PPO | Admitting: Emergency Medicine

## 2018-12-04 ENCOUNTER — Encounter: Payer: Self-pay | Admitting: Emergency Medicine

## 2018-12-04 ENCOUNTER — Other Ambulatory Visit: Payer: Self-pay

## 2018-12-04 VITALS — BP 109/67 | HR 94 | Temp 98.5°F | Resp 16 | Ht 61.25 in | Wt 114.0 lb

## 2018-12-04 DIAGNOSIS — E034 Atrophy of thyroid (acquired): Secondary | ICD-10-CM | POA: Diagnosis not present

## 2018-12-04 DIAGNOSIS — M316 Other giant cell arteritis: Secondary | ICD-10-CM | POA: Diagnosis not present

## 2018-12-04 NOTE — Patient Instructions (Addendum)
If you have lab work done today you will be contacted with your lab results within the next 2 weeks.  If you have not heard from [place] then please contact us. The fastest way to get your results is to register for My Chart.   IF you received an x-ray today, you will receive an invoice from [facility]. Please contact [facility] at [phone] with questions or concerns regarding your invoice.   IF you received labwork today, you will receive an invoice from [place]. Please contact [facility] at [phone] with questions or concerns regarding your invoice.   Our billing staff will not be able to assist you with questions regarding bills from these companies.  You will be contacted with the lab results as soon as they are available. The fastest way to get your results is to activate your My Chart account. Instructions are located on the last page of this paperwork. If you have not heard from [place] regarding the results in 2 weeks, please contact this office.    Health Maintenance, Female Adopting a healthy lifestyle and getting preventive care can go a long way to promote health and wellness. Talk with your health care provider about what schedule of regular examinations is right for you. This is a good chance for you to check in with your provider about disease prevention and staying healthy. In between checkups, there are plenty of things you can do on your own. Experts have done a lot of research about which lifestyle changes and preventive measures are most likely to keep you healthy. Ask your health care provider for more information. Weight and diet Eat a healthy diet  Be sure to include plenty of vegetables, fruits, low-fat dairy products, and lean protein.  Do not eat a lot of foods high in solid fats, added sugars, or salt.  Get regular exercise. This is one of the most important things you can do for your health. ? Most adults should exercise for at least 150  minutes each week. The exercise should increase your heart rate and make you sweat (moderate-intensity exercise). ? Most adults should also do strengthening exercises at least twice a week. This is in addition to the moderate-intensity exercise. Maintain a healthy weight  Body mass index (BMI) is a measurement that can be used to identify possible weight problems. It estimates body fat based on height and weight. Your health care provider can help determine your BMI and help you achieve or maintain a healthy weight.  For females 12 years of age and older: ? A BMI below 18.5 is considered underweight. ? A BMI of 18.5 to 24.9 is normal. ? A BMI of 25 to 29.9 is considered overweight. ? A BMI of 30 and above is considered obese. Watch levels of cholesterol and blood lipids  You should start having your blood tested for lipids and cholesterol at 70 years of age, then have this test every 5 years.  You may need to have your cholesterol levels checked more often if: ? Your lipid or cholesterol levels are high. ? You are older than 70 years of age. ? You are at high risk for heart disease. Cancer screening Lung Cancer  Lung cancer screening is recommended for adults 58-36 years old who are at high risk for lung cancer because of a history of smoking.  A yearly low-dose CT scan of the lungs is recommended for people who: ? Currently smoke. ? Have quit within the past 15  years. ? Have at least a 30-pack-year history of smoking. A pack year is smoking an average of one pack of cigarettes a day for 1 year.  Yearly screening should continue until it has been 15 years since you quit.  Yearly screening should stop if you develop a health problem that would prevent you from having lung cancer treatment. Breast Cancer  Practice breast self-awareness. This means understanding how your breasts normally appear and feel.  It also means doing regular breast self-exams. Let your health care provider  know about any changes, no matter how small.  If you are in your 20s or 30s, you should have a clinical breast exam (CBE) by a health care provider every 1-3 years as part of a regular health exam.  If you are 3 or older, have a CBE every year. Also consider having a breast X-ray (mammogram) every year.  If you have a family history of breast cancer, talk to your health care provider about genetic screening.  If you are at high risk for breast cancer, talk to your health care provider about having an MRI and a mammogram every year.  Breast cancer gene (BRCA) assessment is recommended for women who have family members with BRCA-related cancers. BRCA-related cancers include: ? Breast. ? Ovarian. ? Tubal. ? Peritoneal cancers.  Results of the assessment will determine the need for genetic counseling and BRCA1 and BRCA2 testing. Cervical Cancer Your health care provider may recommend that you be screened regularly for cancer of the pelvic organs (ovaries, uterus, and vagina). This screening involves a pelvic examination, including checking for microscopic changes to the surface of your cervix (Pap test). You may be encouraged to have this screening done every 3 years, beginning at age 63.  For women ages 71-65, health care providers may recommend pelvic exams and Pap testing every 3 years, or they may recommend the Pap and pelvic exam, combined with testing for human papilloma virus (HPV), every 5 years. Some types of HPV increase your risk of cervical cancer. Testing for HPV may also be done on women of any age with unclear Pap test results.  Other health care providers may not recommend any screening for nonpregnant women who are considered low risk for pelvic cancer and who do not have symptoms. Ask your health care provider if a screening pelvic exam is right for you.  If you have had past treatment for cervical cancer or a condition that could lead to cancer, you need Pap tests and  screening for cancer for at least 20 years after your treatment. If Pap tests have been discontinued, your risk factors (such as having a new sexual partner) need to be reassessed to determine if screening should resume. Some women have medical problems that increase the chance of getting cervical cancer. In these cases, your health care provider may recommend more frequent screening and Pap tests. Colorectal Cancer  This type of cancer can be detected and often prevented.  Routine colorectal cancer screening usually begins at 70 years of age and continues through 70 years of age.  Your health care provider may recommend screening at an earlier age if you have risk factors for colon cancer.  Your health care provider may also recommend using home test kits to check for hidden blood in the stool.  A small camera at the end of a tube can be used to examine your colon directly (sigmoidoscopy or colonoscopy). This is done to check for the earliest forms of colorectal cancer.  Routine screening usually begins at age 50.  Direct examination of the colon should be repeated every 5-10 years through 70 years of age. However, you may need to be screened more often if early forms of precancerous polyps or small growths are found. Skin Cancer  Check your skin from head to toe regularly.  Tell your health care provider about any new moles or changes in moles, especially if there is a change in a mole's shape or color.  Also tell your health care provider if you have a mole that is larger than the size of a pencil eraser.  Always use sunscreen. Apply sunscreen liberally and repeatedly throughout the day.  Protect yourself by wearing long sleeves, pants, a wide-brimmed hat, and sunglasses whenever you are outside. Heart disease, diabetes, and high blood pressure  High blood pressure causes heart disease and increases the risk of stroke. High blood pressure is more likely to develop in: ? People who  have blood pressure in the high end of the normal range (130-139/85-89 mm Hg). ? People who are overweight or obese. ? People who are African American.  If you are 18-39 years of age, have your blood pressure checked every 3-5 years. If you are 40 years of age or older, have your blood pressure checked every year. You should have your blood pressure measured twice-once when you are at a hospital or clinic, and once when you are not at a hospital or clinic. Record the average of the two measurements. To check your blood pressure when you are not at a hospital or clinic, you can use: ? An automated blood pressure machine at a pharmacy. ? A home blood pressure monitor.  If you are between 55 years and 79 years old, ask your health care provider if you should take aspirin to prevent strokes.  Have regular diabetes screenings. This involves taking a blood sample to check your fasting blood sugar level. ? If you are at a normal weight and have a low risk for diabetes, have this test once every three years after 70 years of age. ? If you are overweight and have a high risk for diabetes, consider being tested at a younger age or more often. Preventing infection Hepatitis B  If you have a higher risk for hepatitis B, you should be screened for this virus. You are considered at high risk for hepatitis B if: ? You were born in a country where hepatitis B is common. Ask your health care provider which countries are considered high risk. ? Your parents were born in a high-risk country, and you have not been immunized against hepatitis B (hepatitis B vaccine). ? You have HIV or AIDS. ? You use needles to inject street drugs. ? You live with someone who has hepatitis B. ? You have had sex with someone who has hepatitis B. ? You get hemodialysis treatment. ? You take certain medicines for conditions, including cancer, organ transplantation, and autoimmune conditions. Hepatitis C  Blood testing is  recommended for: ? Everyone born from 1945 through 1965. ? Anyone with known risk factors for hepatitis C. Sexually transmitted infections (STIs)  You should be screened for sexually transmitted infections (STIs) including gonorrhea and chlamydia if: ? You are sexually active and are younger than 70 years of age. ? You are older than 70 years of age and your health care provider tells you that you are at risk for this type of infection. ? Your sexual activity has changed since you   were last screened and you are at an increased risk for chlamydia or gonorrhea. Ask your health care provider if you are at risk.  If you do not have HIV, but are at risk, it may be recommended that you take a prescription medicine daily to prevent HIV infection. This is called pre-exposure prophylaxis (PrEP). You are considered at risk if: ? You are sexually active and do not regularly use condoms or know the HIV status of your partner(s). ? You take drugs by injection. ? You are sexually active with a partner who has HIV. Talk with your health care provider about whether you are at high risk of being infected with HIV. If you choose to begin PrEP, you should first be tested for HIV. You should then be tested every 3 months for as long as you are taking PrEP. Pregnancy  If you are premenopausal and you may become pregnant, ask your health care provider about preconception counseling.  If you may become pregnant, take 400 to 800 micrograms (mcg) of folic acid every day.  If you want to prevent pregnancy, talk to your health care provider about birth control (contraception). Osteoporosis and menopause  Osteoporosis is a disease in which the bones lose minerals and strength with aging. This can result in serious bone fractures. Your risk for osteoporosis can be identified using a bone density scan.  If you are 65 years of age or older, or if you are at risk for osteoporosis and fractures, ask your health care  provider if you should be screened.  Ask your health care provider whether you should take a calcium or vitamin D supplement to lower your risk for osteoporosis.  Menopause may have certain physical symptoms and risks.  Hormone replacement therapy may reduce some of these symptoms and risks. Talk to your health care provider about whether hormone replacement therapy is right for you. Follow these instructions at home:  Schedule regular health, dental, and eye exams.  Stay current with your immunizations.  Do not use any tobacco products including cigarettes, chewing tobacco, or electronic cigarettes.  If you are pregnant, do not drink alcohol.  If you are breastfeeding, limit how much and how often you drink alcohol.  Limit alcohol intake to no more than 1 drink per day for nonpregnant women. One drink equals 12 ounces of beer, 5 ounces of wine, or 1 ounces of hard liquor.  Do not use street drugs.  Do not share needles.  Ask your health care provider for help if you need support or information about quitting drugs.  Tell your health care provider if you often feel depressed.  Tell your health care provider if you have ever been abused or do not feel safe at home. This information is not intended to replace advice given to you by your health care provider. Make sure you discuss any questions you have with your health care provider. Document Released: 04/30/2011 Document Revised: 03/22/2016 Document Reviewed: 07/19/2015 [employer] Interactive Patient Education  2019 [employer]  

## 2018-12-04 NOTE — Progress Notes (Signed)
Alyssa Grant 68 y.o.   Chief Complaint  Patient presents with  . 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 female with history of temporal arteritis on prednisone since last June seeing rheumatologist.  Also has thyroid condition for which she takes thyroid hormones but has not been tested since last March.  Requesting TFTs  HPI   Prior to Admission medications   Medication Sig Start Date End Date Taking? Authorizing Provider  ferrous sulfate 325 (65 FE) MG tablet Take 325 mg by mouth as needed.    Yes [provider]  levothyroxine (SYNTHROID, LEVOTHROID) 75 MCG tablet Take 1 tablet (75 mcg total) by mouth daily before breakfast. 01/15/18  Yes Wardell Honour, MD  liothyronine (CYTOMEL) 25 MCG tablet Take 1 tablet (25 mcg total) by mouth daily. 01/15/18  Yes Wardell Honour, MD  Multiple Vitamin (MULTIVITAMIN) tablet Take 1 tablet by mouth daily.   Yes [provider]  omeprazole (PRILOSEC) 40 MG capsule Take 40 mg by mouth daily.   Yes [provider]  OVER THE COUNTER MEDICATION OTC Fish Oil 320 mg EPA, 200 mg DHA taking daily   Yes [provider]  OVER THE COUNTER MEDICATION Magnesium Malate 1250 mg taking daily   Yes [provider]  OVER THE COUNTER MEDICATION Vitamin D 2000 iu taking daily   Yes [provider]  OVER THE COUNTER MEDICATION Biotin 5000 mcg taking daily   Yes [provider]  POTASSIUM CHLORIDE PO Take 550 mg by mouth 2 (two) times daily.   Yes [provider]  predniSONE (DELTASONE) 50 MG tablet Take 1 tablet (50 mg total) by mouth daily with breakfast. 04/28/18  Yes Timmothy Euler, [place] D, PA-C  vitamin E 400 UNIT capsule Take 400 Units by mouth daily.   Yes [provider]  doxycycline (VIBRAMYCIN) 100 MG capsule Take 1 capsule (100 mg total) by mouth 2 (two) times daily. Patient not  taking: Reported on 12/04/2018 03/06/18   Tenna Delaine D, PA-C    Allergies  Allergen Reactions  . Levaquin [Levofloxacin]   . Penicillins     Rash/swelling  . Sulfa Antibiotics     rash    Patient Active Problem List   Diagnosis Date Noted  . Fibromyalgia 01/02/2017  . Osteoporosis 12/30/2015  . Underweight 12/28/2014  . Thyroid activity decreased 12/28/2014    Past Medical History:  Diagnosis Date  . Allergy    Claritin; seasonal.  . Arthritis    Hands, hips, knees.  Ibuprofen PRN.  . Fibromyalgia    dx by [facility]  . Hypothyroidism    goiter; onset age 63.  . Osteoporosis     Past Surgical History:  Procedure Laterality Date  . BREAST SURGERY     breast cyst resection; benign.  x 2.    Social History   Socioeconomic History  . Marital status: Widowed    Spouse name: Widowed  . Number of children: 1  . Years of education: Not on file  . Highest education level: Not on file  Occupational History  . 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  Social Needs  . Financial resource strain: Not hard at all  . Food insecurity:    Worry: Never true    Inability: Never true  . Transportation needs:    Medical:  No    Non-medical: No  Tobacco Use  . Smoking status: Never Smoker  . Smokeless tobacco: Never Used  Substance and Sexual Activity  . Alcohol use: No    Alcohol/week: 0.0 standard drinks  . Drug use: No  . Sexual activity: Not Currently    Partners: Male    Birth control/protection: Post-menopausal  Lifestyle  . Physical activity:    Days per week: 7 days    Minutes per session: 40 min  . Stress: Not at all  Relationships  . Social connections:    Talks on phone: More than three times a week    Gets together: Once a week    Attends religious service: More than 4 times per year    Active member of club or organization: Yes    Attends meetings of clubs or organizations: More than 4 times per year     Relationship status: Widowed  . Intimate partner violence:    Fear of current or ex partner: No    Emotionally abused: No    Physically abused: No    Forced sexual activity: No  Other Topics Concern  . Not on file  Social History Narrative   Marital status:  Widowed; husband died suddenly (MS, pacemaker, atrial fibrillation on Coumadin; head trauma with bleed s/p emergency surgery with persistent bleeding).  2013.  Not dating in 2019; not interested.        Children:  1 daughter; 1 stepson; 2 stepgrandchildren.  Daughter in [place]; [place] in [place].      Lives: alone; has dog and cat.      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        Tobacco:  never       Alcohol:  Rarely; holidays      Exercise:  Walking 2 miles per day.      Seatbelts:  100%      Guns:  None      ADLs:  Independent with all ADLs; drives/cooks/finances.  No assistant devices.        Advanced Directives:  Living Will; HCPOA is daughter/Amber Sherlean Foot [phone]); FULL CODE but no prolonged measures.               Family History  Problem Relation Age of Onset  . Heart disease Mother 108       AMI x 2  . Hyperlipidemia Mother   . COPD Mother   . Stroke Mother 6  . Cancer Father        prostate cancer; lung cancer  . Heart disease Father   . Hyperlipidemia Father   . Hypertension Father   . Alcohol abuse Father   . Cancer Sister 20       bone cancer? s/p radiation and chemotherapy  . Hyperlipidemia Sister   . Hypertension Sister   . Diabetes Brother   . Hyperlipidemia Brother   . Hypertension Brother   . Stroke Brother   . Heart disease Brother   . Hyperlipidemia Brother   . Hypertension Brother   . Stroke Brother   . Diabetes Sister   . Hyperlipidemia Sister   . Hypertension Sister   . Heart disease Sister 21       CABG/CAD  . Anxiety disorder Sister      Review of Systems   Constitutional: Negative.  Negative for chills and fever.  HENT: Negative.  Negative for congestion, sinus pain and sore throat.   Eyes: Negative.   Respiratory: Negative.  Negative for cough and shortness of breath.   Cardiovascular: Negative.  Negative for chest pain and palpitations.  Gastrointestinal: Negative.   Genitourinary: Negative.   Musculoskeletal: Negative.   Skin: Negative.  Negative for rash.  Neurological: Negative.  Negative for dizziness and headaches.  Endo/Heme/Allergies: Negative.   All other systems reviewed and are negative.   Vitals:   12/04/18 0953  BP: 109/67  Pulse: 94  Resp: 16  Temp: 98.5 F (36.9 C)  SpO2: 94%    Physical Exam Vitals signs reviewed.  Constitutional:      Appearance: Normal appearance.  HENT:     Head: Normocephalic and atraumatic.     Nose: Nose normal.     Mouth/Throat:     Mouth: Mucous membranes are moist.     Pharynx: Oropharynx is clear.  Eyes:     Extraocular Movements: Extraocular movements intact.     Conjunctiva/sclera: Conjunctivae normal.     Pupils: Pupils are equal, round, and reactive to light.  Neck:     Musculoskeletal: Normal range of motion and neck supple.  Cardiovascular:     Rate and Rhythm: Normal rate and regular rhythm.     Pulses: Normal pulses.     Heart sounds: Normal heart sounds.  Pulmonary:     Effort: Pulmonary effort is normal.     Breath sounds: Normal breath sounds.  Musculoskeletal: Normal range of motion.  Skin:    General: Skin is warm and dry.     Capillary Refill: Capillary refill takes less than 2 seconds.  Neurological:     General: No focal deficit present.     Mental Status: She is alert and oriented to person, place, and time.      ASSESSMENT & PLAN: Maralyn was seen today for labwork.  Diagnoses and all orders for this visit:  Giant cell arteritis ([place])  Hypothyroidism due to acquired atrophy of thyroid -     Thyroid Panel With TSH   Patient Instructions        If you have lab work done today you will be contacted with your lab results within the next 2 weeks.  If you have not heard from [place] then please contact us. The fastest way to get your results is to register for My Chart.   IF you received an x-ray today, you will receive an invoice from [facility]. Please contact [facility] at [phone] with questions or concerns regarding your invoice.   IF you received labwork today, you will receive an invoice from [facility]. Please contact [facility] at [phone] with questions or concerns regarding your invoice.   Our billing staff will not be able to assist you with questions regarding bills from these companies.  You will be contacted with the lab results as soon as they are available. The fastest way to get your results is to activate your My Chart account. Instructions are located on the last page of this paperwork. If you have not heard from [place] regarding the results in 2 weeks, please contact this office.    Health Maintenance, Female Adopting a healthy lifestyle and getting preventive care can go a long way to promote health and wellness. Talk with your health care provider about what schedule of regular examinations is right for you. This is a good chance for you to check in with your provider about disease prevention and staying healthy. In between  checkups, there are plenty of things you can do on your own. Experts have done a lot of research about which lifestyle changes and preventive measures are most likely to keep you healthy. Ask your health care provider for more information. Weight and diet Eat a healthy diet  Be sure to include plenty of vegetables, fruits, low-fat dairy products, and lean protein.  Do not eat a lot of foods high in solid fats, added sugars, or salt.  Get regular exercise. This is one of the most important things you can do for your health. ? Most adults should exercise for at least 150  minutes each week. The exercise should increase your heart rate and make you sweat (moderate-intensity exercise). ? Most adults should also do strengthening exercises at least twice a week. This is in addition to the moderate-intensity exercise. Maintain a healthy weight  Body mass index (BMI) is a measurement that can be used to identify possible weight problems. It estimates body fat based on height and weight. Your health care provider can help determine your BMI and help you achieve or maintain a healthy weight.  For females 73 years of age and older: ? A BMI below 18.5 is considered underweight. ? A BMI of 18.5 to 24.9 is normal. ? A BMI of 25 to 29.9 is considered overweight. ? A BMI of 30 and above is considered obese. Watch levels of cholesterol and blood lipids  You should start having your blood tested for lipids and cholesterol at 70 years of age, then have this test every 5 years.  You may need to have your cholesterol levels checked more often if: ? Your lipid or cholesterol levels are high. ? You are older than 70 years of age. ? You are at high risk for heart disease. Cancer screening Lung Cancer  Lung cancer screening is recommended for adults 12-52 years old who are at high risk for lung cancer because of a history of smoking.  A yearly low-dose CT scan of the lungs is recommended for people who: ? Currently smoke. ? Have quit within the past 15 years. ? Have at least a 30-pack-year history of smoking. A pack year is smoking an average of one pack of cigarettes a day for 1 year.  Yearly screening should continue until it has been 15 years since you quit.  Yearly screening should stop if you develop a health problem that would prevent you from having lung cancer treatment. Breast Cancer  Practice breast self-awareness. This means understanding how your breasts normally appear and feel.  It also means doing regular breast self-exams. Let your health care provider  know about any changes, no matter how small.  If you are in your 20s or 30s, you should have a clinical breast exam (CBE) by a health care provider every 1-3 years as part of a regular health exam.  If you are 49 or older, have a CBE every year. Also consider having a breast X-ray (mammogram) every year.  If you have a family history of breast cancer, talk to your health care provider about genetic screening.  If you are at high risk for breast cancer, talk to your health care provider about having an MRI and a mammogram every year.  Breast cancer gene (BRCA) assessment is recommended for women who have family members with BRCA-related cancers. BRCA-related cancers include: ? Breast. ? Ovarian. ? Tubal. ? Peritoneal cancers.  Results of the assessment will determine the need for genetic counseling and BRCA1  and BRCA2 testing. Cervical Cancer Your health care provider may recommend that you be screened regularly for cancer of the pelvic organs (ovaries, uterus, and vagina). This screening involves a pelvic examination, including checking for microscopic changes to the surface of your cervix (Pap test). You may be encouraged to have this screening done every 3 years, beginning at age 73.  For women ages 60-65, health care providers may recommend pelvic exams and Pap testing every 3 years, or they may recommend the Pap and pelvic exam, combined with testing for human papilloma virus (HPV), every 5 years. Some types of HPV increase your risk of cervical cancer. Testing for HPV may also be done on women of any age with unclear Pap test results.  Other health care providers may not recommend any screening for nonpregnant women who are considered low risk for pelvic cancer and who do not have symptoms. Ask your health care provider if a screening pelvic exam is right for you.  If you have had past treatment for cervical cancer or a condition that could lead to cancer, you need Pap tests and  screening for cancer for at least 20 years after your treatment. If Pap tests have been discontinued, your risk factors (such as having a new sexual partner) need to be reassessed to determine if screening should resume. Some women have medical problems that increase the chance of getting cervical cancer. In these cases, your health care provider may recommend more frequent screening and Pap tests. Colorectal Cancer  This type of cancer can be detected and often prevented.  Routine colorectal cancer screening usually begins at 70 years of age and continues through 70 years of age.  Your health care provider may recommend screening at an earlier age if you have risk factors for colon cancer.  Your health care provider may also recommend using home test kits to check for hidden blood in the stool.  A small camera at the end of a tube can be used to examine your colon directly (sigmoidoscopy or colonoscopy). This is done to check for the earliest forms of colorectal cancer.  Routine screening usually begins at age 19.  Direct examination of the colon should be repeated every 5-10 years through 70 years of age. However, you may need to be screened more often if early forms of precancerous polyps or small growths are found. Skin Cancer  Check your skin from head to toe regularly.  Tell your health care provider about any new moles or changes in moles, especially if there is a change in a mole's shape or color.  Also tell your health care provider if you have a mole that is larger than the size of a pencil eraser.  Always use sunscreen. Apply sunscreen liberally and repeatedly throughout the day.  Protect yourself by wearing long sleeves, pants, a wide-brimmed hat, and sunglasses whenever you are outside. Heart disease, diabetes, and high blood pressure  High blood pressure causes heart disease and increases the risk of stroke. High blood pressure is more likely to develop in: ? People who  have blood pressure in the high end of the normal range (130-139/85-89 mm Hg). ? People who are overweight or obese. ? People who are African American.  If you are 40-76 years of age, have your blood pressure checked every 3-5 years. If you are 26 years of age or older, have your blood pressure checked every year. You should have your blood pressure measured twice-once when you are at a hospital  or clinic, and once when you are not at a hospital or clinic. Record the average of the two measurements. To check your blood pressure when you are not at a hospital or clinic, you can use: ? An automated blood pressure machine at a pharmacy. ? A home blood pressure monitor.  If you are between 63 years and 58 years old, ask your health care provider if you should take aspirin to prevent strokes.  Have regular diabetes screenings. This involves taking a blood sample to check your fasting blood sugar level. ? If you are at a normal weight and have a low risk for diabetes, have this test once every three years after 70 years of age. ? If you are overweight and have a high risk for diabetes, consider being tested at a younger age or more often. Preventing infection Hepatitis B  If you have a higher risk for hepatitis B, you should be screened for this virus. You are considered at high risk for hepatitis B if: ? You were born in a country where hepatitis B is common. Ask your health care provider which countries are considered high risk. ? Your parents were born in a high-risk country, and you have not been immunized against hepatitis B (hepatitis B vaccine). ? You have HIV or AIDS. ? You use needles to inject street drugs. ? You live with someone who has hepatitis B. ? You have had sex with someone who has hepatitis B. ? You get hemodialysis treatment. ? You take certain medicines for conditions, including cancer, organ transplantation, and autoimmune conditions. Hepatitis C  Blood testing is  recommended for: ? Everyone born from [birth year] through 1967. ? Anyone with known risk factors for hepatitis C. Sexually transmitted infections (STIs)  You should be screened for sexually transmitted infections (STIs) including gonorrhea and chlamydia if: ? You are sexually active and are younger than 70 years of age. ? You are older than 70 years of age and your health care provider tells you that you are at risk for this type of infection. ? Your sexual activity has changed since you were last screened and you are at an increased risk for chlamydia or gonorrhea. Ask your health care provider if you are at risk.  If you do not have HIV, but are at risk, it may be recommended that you take a prescription medicine daily to prevent HIV infection. This is called pre-exposure prophylaxis (PrEP). You are considered at risk if: ? You are sexually active and do not regularly use condoms or know the HIV status of your partner(s). ? You take drugs by injection. ? You are sexually active with a partner who has HIV. Talk with your health care provider about whether you are at high risk of being infected with HIV. If you choose to begin PrEP, you should first be tested for HIV. You should then be tested every 3 months for as long as you are taking PrEP. Pregnancy  If you are premenopausal and you may become pregnant, ask your health care provider about preconception counseling.  If you may become pregnant, take 400 to 800 micrograms (mcg) of folic acid every day.  If you want to prevent pregnancy, talk to your health care provider about birth control (contraception). Osteoporosis and menopause  Osteoporosis is a disease in which the bones lose minerals and strength with aging. This can result in serious bone fractures. Your risk for osteoporosis can be identified using a bone density scan.  If you  are 8 years of age or older, or if you are at risk for osteoporosis and fractures, ask your health care  provider if you should be screened.  Ask your health care provider whether you should take a calcium or vitamin D supplement to lower your risk for osteoporosis.  Menopause may have certain physical symptoms and risks.  Hormone replacement therapy may reduce some of these symptoms and risks. Talk to your health care provider about whether hormone replacement therapy is right for you. Follow these instructions at home:  Schedule regular health, dental, and eye exams.  Stay current with your immunizations.  Do not use any tobacco products including cigarettes, chewing tobacco, or electronic cigarettes.  If you are pregnant, do not drink alcohol.  If you are breastfeeding, limit how much and how often you drink alcohol.  Limit alcohol intake to no more than 1 drink per day for nonpregnant women. One drink equals 12 ounces of beer, 5 ounces of wine, or 1 ounces of hard liquor.  Do not use street drugs.  Do not share needles.  Ask your health care provider for help if you need support or information about quitting drugs.  Tell your health care provider if you often feel depressed.  Tell your health care provider if you have ever been abused or do not feel safe at home. This information is not intended to replace advice given to you by your health care provider. Make sure you discuss any questions you have with your health care provider. Document Released: 04/30/2011 Document Revised: 03/22/2016 Document Reviewed: 07/19/2015 Elsevier Interactive Patient Education  2019 Elsevier Inc.      Agustina Caroli, MD [facility]

## 2018-12-05 LAB — THYROID PANEL WITH TSH
Free Thyroxine Index: 1.6 (ref 1.2–4.9)
T3 Uptake Ratio: 26 % (ref 24–39)
T4, Total: 6.3 ug/dL (ref 4.5–12.0)
TSH: 0.099 u[IU]/mL — ABNORMAL LOW (ref 0.450–4.500)

## 2019-01-20 DIAGNOSIS — M810 Age-related osteoporosis without current pathological fracture: Secondary | ICD-10-CM | POA: Diagnosis not present

## 2019-01-20 DIAGNOSIS — R51 Headache: Secondary | ICD-10-CM | POA: Diagnosis not present

## 2019-01-20 DIAGNOSIS — M797 Fibromyalgia: Secondary | ICD-10-CM | POA: Diagnosis not present

## 2019-01-20 DIAGNOSIS — K219 Gastro-esophageal reflux disease without esophagitis: Secondary | ICD-10-CM | POA: Diagnosis not present

## 2019-01-20 DIAGNOSIS — M316 Other giant cell arteritis: Secondary | ICD-10-CM | POA: Diagnosis not present

## 2019-01-20 DIAGNOSIS — R700 Elevated erythrocyte sedimentation rate: Secondary | ICD-10-CM | POA: Diagnosis not present

## 2019-01-20 DIAGNOSIS — Z7952 Long term (current) use of systemic steroids: Secondary | ICD-10-CM | POA: Diagnosis not present

## 2019-01-22 ENCOUNTER — Telehealth: Payer: Self-pay

## 2019-01-22 ENCOUNTER — Telehealth: Payer: Self-pay | Admitting: Emergency Medicine

## 2019-01-22 ENCOUNTER — Other Ambulatory Visit: Payer: Self-pay

## 2019-01-22 DIAGNOSIS — E034 Atrophy of thyroid (acquired): Secondary | ICD-10-CM

## 2019-01-22 MED ORDER — LIOTHYRONINE SODIUM 25 MCG PO TABS: 25.0000 ug | ORAL_TABLET | Freq: Every day | ORAL | 3 refills | Status: DC

## 2019-01-22 MED ORDER — LEVOTHYROXINE SODIUM 75 MCG PO TABS: 75.0000 ug | ORAL_TABLET | Freq: Every day | ORAL | 3 refills | Status: DC

## 2019-01-22 NOTE — Telephone Encounter (Signed)
Patient states she has made many attempts to get her medication and has left many messages her medicine is still not at the pharmacy and she needs it no later than tomorrow  please call asap  [phone]  [facility]  [phone]

## 2019-01-22 NOTE — Telephone Encounter (Signed)
PT came in today requesting her thyroid medications, both meds wee sent to the pharmacy

## 2019-01-23 NOTE — Telephone Encounter (Signed)
Okay to refill her medications but she needs to schedule appointment to establish care with me as her primary.  Thanks.

## 2019-01-24 NOTE — Telephone Encounter (Signed)
Please call pt and schedule a follow-up with Dr. Alvy Bimler.

## 2019-02-24 ENCOUNTER — Telehealth: Payer: Self-pay | Admitting: *Deleted

## 2019-02-24 NOTE — Telephone Encounter (Signed)
Called to schedule AWV

## 2019-03-06 DIAGNOSIS — Z20828 Contact with and (suspected) exposure to other viral communicable diseases: Secondary | ICD-10-CM | POA: Diagnosis not present

## 2019-03-26 ENCOUNTER — Telehealth: Payer: Self-pay | Admitting: Emergency Medicine

## 2019-03-26 NOTE — Telephone Encounter (Signed)
Called pt to see if she wanted  to make an appt. Soon for refills per CRM from 2 months ago pt was given refills but needed to follow up FR

## 2019-03-26 NOTE — Telephone Encounter (Signed)
Called pt to set up appt for med refills LVM FR

## 2019-04-06 DIAGNOSIS — H43813 Vitreous degeneration, bilateral: Secondary | ICD-10-CM | POA: Diagnosis not present

## 2019-04-06 DIAGNOSIS — H52203 Unspecified astigmatism, bilateral: Secondary | ICD-10-CM | POA: Diagnosis not present

## 2019-04-06 DIAGNOSIS — H25813 Combined forms of age-related cataract, bilateral: Secondary | ICD-10-CM | POA: Diagnosis not present

## 2019-04-06 DIAGNOSIS — H35373 Puckering of macula, bilateral: Secondary | ICD-10-CM | POA: Diagnosis not present

## 2019-04-07 ENCOUNTER — Telehealth: Payer: Self-pay | Admitting: *Deleted

## 2019-04-07 NOTE — Telephone Encounter (Signed)
Schedule AWV.  

## 2019-04-22 DIAGNOSIS — R700 Elevated erythrocyte sedimentation rate: Secondary | ICD-10-CM | POA: Diagnosis not present

## 2019-04-22 DIAGNOSIS — K219 Gastro-esophageal reflux disease without esophagitis: Secondary | ICD-10-CM | POA: Diagnosis not present

## 2019-04-22 DIAGNOSIS — Z7952 Long term (current) use of systemic steroids: Secondary | ICD-10-CM | POA: Diagnosis not present

## 2019-04-22 DIAGNOSIS — M810 Age-related osteoporosis without current pathological fracture: Secondary | ICD-10-CM | POA: Diagnosis not present

## 2019-04-22 DIAGNOSIS — Z7189 Other specified counseling: Secondary | ICD-10-CM | POA: Diagnosis not present

## 2019-04-22 DIAGNOSIS — R51 Headache: Secondary | ICD-10-CM | POA: Diagnosis not present

## 2019-04-22 DIAGNOSIS — M797 Fibromyalgia: Secondary | ICD-10-CM | POA: Diagnosis not present

## 2019-04-22 DIAGNOSIS — M316 Other giant cell arteritis: Secondary | ICD-10-CM | POA: Diagnosis not present

## 2019-04-30 DIAGNOSIS — Z1389 Encounter for screening for other disorder: Secondary | ICD-10-CM | POA: Diagnosis not present

## 2019-04-30 DIAGNOSIS — Z0000 Encounter for general adult medical examination without abnormal findings: Secondary | ICD-10-CM | POA: Diagnosis not present

## 2019-04-30 DIAGNOSIS — Z1211 Encounter for screening for malignant neoplasm of colon: Secondary | ICD-10-CM | POA: Diagnosis not present

## 2019-04-30 DIAGNOSIS — Z8249 Family history of ischemic heart disease and other diseases of the circulatory system: Secondary | ICD-10-CM | POA: Diagnosis not present

## 2019-04-30 DIAGNOSIS — E039 Hypothyroidism, unspecified: Secondary | ICD-10-CM | POA: Diagnosis not present

## 2019-04-30 DIAGNOSIS — M316 Other giant cell arteritis: Secondary | ICD-10-CM | POA: Diagnosis not present

## 2019-05-01 DIAGNOSIS — H43392 Other vitreous opacities, left eye: Secondary | ICD-10-CM | POA: Diagnosis not present

## 2019-05-01 DIAGNOSIS — H43811 Vitreous degeneration, right eye: Secondary | ICD-10-CM | POA: Diagnosis not present

## 2019-05-01 DIAGNOSIS — H43812 Vitreous degeneration, left eye: Secondary | ICD-10-CM | POA: Diagnosis not present

## 2019-05-01 DIAGNOSIS — H35373 Puckering of macula, bilateral: Secondary | ICD-10-CM | POA: Diagnosis not present

## 2019-05-07 DIAGNOSIS — H43392 Other vitreous opacities, left eye: Secondary | ICD-10-CM | POA: Diagnosis not present

## 2019-05-07 DIAGNOSIS — H43812 Vitreous degeneration, left eye: Secondary | ICD-10-CM | POA: Diagnosis not present

## 2019-05-07 DIAGNOSIS — H35372 Puckering of macula, left eye: Secondary | ICD-10-CM | POA: Diagnosis not present

## 2019-05-07 DIAGNOSIS — H2513 Age-related nuclear cataract, bilateral: Secondary | ICD-10-CM | POA: Diagnosis not present

## 2019-05-12 DIAGNOSIS — H268 Other specified cataract: Secondary | ICD-10-CM | POA: Diagnosis not present

## 2019-05-12 DIAGNOSIS — H25812 Combined forms of age-related cataract, left eye: Secondary | ICD-10-CM | POA: Diagnosis not present

## 2019-06-03 DIAGNOSIS — H2511 Age-related nuclear cataract, right eye: Secondary | ICD-10-CM | POA: Diagnosis not present

## 2019-06-03 DIAGNOSIS — H35373 Puckering of macula, bilateral: Secondary | ICD-10-CM | POA: Diagnosis not present

## 2019-06-03 DIAGNOSIS — H43393 Other vitreous opacities, bilateral: Secondary | ICD-10-CM | POA: Diagnosis not present

## 2019-06-03 DIAGNOSIS — H43813 Vitreous degeneration, bilateral: Secondary | ICD-10-CM | POA: Diagnosis not present

## 2019-06-09 DIAGNOSIS — H268 Other specified cataract: Secondary | ICD-10-CM | POA: Diagnosis not present

## 2019-06-09 DIAGNOSIS — H25811 Combined forms of age-related cataract, right eye: Secondary | ICD-10-CM | POA: Diagnosis not present

## 2019-07-09 DIAGNOSIS — Z20828 Contact with and (suspected) exposure to other viral communicable diseases: Secondary | ICD-10-CM | POA: Diagnosis not present

## 2019-07-23 DIAGNOSIS — M797 Fibromyalgia: Secondary | ICD-10-CM | POA: Diagnosis not present

## 2019-07-23 DIAGNOSIS — M810 Age-related osteoporosis without current pathological fracture: Secondary | ICD-10-CM | POA: Diagnosis not present

## 2019-07-23 DIAGNOSIS — R700 Elevated erythrocyte sedimentation rate: Secondary | ICD-10-CM | POA: Diagnosis not present

## 2019-07-23 DIAGNOSIS — Z7952 Long term (current) use of systemic steroids: Secondary | ICD-10-CM | POA: Diagnosis not present

## 2019-07-23 DIAGNOSIS — K219 Gastro-esophageal reflux disease without esophagitis: Secondary | ICD-10-CM | POA: Diagnosis not present

## 2019-07-23 DIAGNOSIS — M316 Other giant cell arteritis: Secondary | ICD-10-CM | POA: Diagnosis not present

## 2019-07-23 DIAGNOSIS — R51 Headache: Secondary | ICD-10-CM | POA: Diagnosis not present

## 2019-08-03 DIAGNOSIS — Z7952 Long term (current) use of systemic steroids: Secondary | ICD-10-CM | POA: Diagnosis not present

## 2019-08-03 DIAGNOSIS — M797 Fibromyalgia: Secondary | ICD-10-CM | POA: Diagnosis not present

## 2019-08-04 DIAGNOSIS — H43813 Vitreous degeneration, bilateral: Secondary | ICD-10-CM | POA: Diagnosis not present

## 2019-08-04 DIAGNOSIS — H35373 Puckering of macula, bilateral: Secondary | ICD-10-CM | POA: Diagnosis not present

## 2019-08-04 DIAGNOSIS — H43393 Other vitreous opacities, bilateral: Secondary | ICD-10-CM | POA: Diagnosis not present

## 2019-08-04 DIAGNOSIS — H33322 Round hole, left eye: Secondary | ICD-10-CM | POA: Diagnosis not present

## 2019-08-25 DIAGNOSIS — H35373 Puckering of macula, bilateral: Secondary | ICD-10-CM | POA: Diagnosis not present

## 2019-08-25 DIAGNOSIS — H43393 Other vitreous opacities, bilateral: Secondary | ICD-10-CM | POA: Diagnosis not present

## 2019-09-10 DIAGNOSIS — H35372 Puckering of macula, left eye: Secondary | ICD-10-CM | POA: Diagnosis not present

## 2019-09-10 DIAGNOSIS — H3581 Retinal edema: Secondary | ICD-10-CM | POA: Diagnosis not present

## 2019-09-10 DIAGNOSIS — H26492 Other secondary cataract, left eye: Secondary | ICD-10-CM | POA: Diagnosis not present

## 2019-09-11 DIAGNOSIS — H3581 Retinal edema: Secondary | ICD-10-CM | POA: Diagnosis not present

## 2019-09-11 DIAGNOSIS — H35372 Puckering of macula, left eye: Secondary | ICD-10-CM | POA: Diagnosis not present

## 2019-09-18 DIAGNOSIS — H35372 Puckering of macula, left eye: Secondary | ICD-10-CM | POA: Diagnosis not present

## 2019-10-09 DIAGNOSIS — H3581 Retinal edema: Secondary | ICD-10-CM | POA: Diagnosis not present

## 2019-10-26 DIAGNOSIS — M316 Other giant cell arteritis: Secondary | ICD-10-CM | POA: Diagnosis not present

## 2019-10-26 DIAGNOSIS — R700 Elevated erythrocyte sedimentation rate: Secondary | ICD-10-CM | POA: Diagnosis not present

## 2019-10-26 DIAGNOSIS — R519 Headache, unspecified: Secondary | ICD-10-CM | POA: Diagnosis not present

## 2019-10-26 DIAGNOSIS — M797 Fibromyalgia: Secondary | ICD-10-CM | POA: Diagnosis not present

## 2019-10-26 DIAGNOSIS — K219 Gastro-esophageal reflux disease without esophagitis: Secondary | ICD-10-CM | POA: Diagnosis not present

## 2019-10-26 DIAGNOSIS — M810 Age-related osteoporosis without current pathological fracture: Secondary | ICD-10-CM | POA: Diagnosis not present

## 2019-10-26 DIAGNOSIS — Z7952 Long term (current) use of systemic steroids: Secondary | ICD-10-CM | POA: Diagnosis not present

## 2019-11-19 ENCOUNTER — Ambulatory Visit: Payer: PPO | Attending: Internal Medicine

## 2019-11-19 DIAGNOSIS — Z23 Encounter for immunization: Secondary | ICD-10-CM | POA: Insufficient documentation

## 2019-11-19 NOTE — Progress Notes (Signed)
   Covid-19 Vaccination Clinic  Name:  Alyssa Grant    MRN: [id number] DOB: Sep 11, 1951  11/19/2019  [name] was observed post Covid-19 immunization for 15 minutes without incidence. She was provided with Vaccine Information Sheet and instruction to access the V-Safe system.   [name] was instructed to call 911 with any severe reactions post vaccine: Marland Kitchen Difficulty breathing  . Swelling of your face and throat  . A fast heartbeat  . A bad rash all over your body  . Dizziness and weakness    Immunizations Administered    Name Date Dose VIS Date Route   Pfizer COVID-19 Vaccine 11/19/2019  5:39 PM 0.3 mL 10/09/2019 Intramuscular   Manufacturer: [employer], [employer]   Lot: [id number]   NDC: [address]-4

## 2019-12-10 ENCOUNTER — Ambulatory Visit: Payer: PPO | Attending: Internal Medicine

## 2019-12-10 DIAGNOSIS — Z23 Encounter for immunization: Secondary | ICD-10-CM | POA: Insufficient documentation

## 2019-12-10 NOTE — Progress Notes (Signed)
   Covid-19 Vaccination Clinic  Name:  Alyssa Grant    MRN: [id number] DOB: 10-Feb-1951  12/10/2019  [name] was observed post Covid-19 immunization for 15 minutes without incidence. She was provided with Vaccine Information Sheet and instruction to access the V-Safe system.   [name] was instructed to call 911 with any severe reactions post vaccine: Marland Kitchen Difficulty breathing  . Swelling of your face and throat  . A fast heartbeat  . A bad rash all over your body  . Dizziness and weakness    Immunizations Administered    Name Date Dose VIS Date Route   Pfizer COVID-19 Vaccine 12/10/2019  5:13 PM 0.3 mL 10/09/2019 Intramuscular   Manufacturer: [employer], [employer]   Lot: [id number]   NDC: [address]-0

## 2019-12-15 ENCOUNTER — Telehealth: Payer: Self-pay | Admitting: *Deleted

## 2019-12-15 NOTE — Telephone Encounter (Signed)
Schedule awv  

## 2020-01-18 DIAGNOSIS — H52201 Unspecified astigmatism, right eye: Secondary | ICD-10-CM | POA: Diagnosis not present

## 2020-01-18 DIAGNOSIS — H43811 Vitreous degeneration, right eye: Secondary | ICD-10-CM | POA: Diagnosis not present

## 2020-01-18 DIAGNOSIS — H35371 Puckering of macula, right eye: Secondary | ICD-10-CM | POA: Diagnosis not present

## 2020-01-18 DIAGNOSIS — H26491 Other secondary cataract, right eye: Secondary | ICD-10-CM | POA: Diagnosis not present

## 2020-02-17 DIAGNOSIS — M797 Fibromyalgia: Secondary | ICD-10-CM | POA: Diagnosis not present

## 2020-02-17 DIAGNOSIS — R700 Elevated erythrocyte sedimentation rate: Secondary | ICD-10-CM | POA: Diagnosis not present

## 2020-02-17 DIAGNOSIS — Z7952 Long term (current) use of systemic steroids: Secondary | ICD-10-CM | POA: Diagnosis not present

## 2020-02-17 DIAGNOSIS — R519 Headache, unspecified: Secondary | ICD-10-CM | POA: Diagnosis not present

## 2020-02-17 DIAGNOSIS — K219 Gastro-esophageal reflux disease without esophagitis: Secondary | ICD-10-CM | POA: Diagnosis not present

## 2020-02-17 DIAGNOSIS — M316 Other giant cell arteritis: Secondary | ICD-10-CM | POA: Diagnosis not present

## 2020-02-17 DIAGNOSIS — M810 Age-related osteoporosis without current pathological fracture: Secondary | ICD-10-CM | POA: Diagnosis not present

## 2020-02-18 DIAGNOSIS — H26491 Other secondary cataract, right eye: Secondary | ICD-10-CM | POA: Diagnosis not present

## 2020-03-04 DIAGNOSIS — S40862A Insect bite (nonvenomous) of left upper arm, initial encounter: Secondary | ICD-10-CM | POA: Diagnosis not present

## 2020-03-04 DIAGNOSIS — W57XXXA Bitten or stung by nonvenomous insect and other nonvenomous arthropods, initial encounter: Secondary | ICD-10-CM | POA: Diagnosis not present

## 2020-05-12 DIAGNOSIS — E7800 Pure hypercholesterolemia, unspecified: Secondary | ICD-10-CM | POA: Diagnosis not present

## 2020-05-12 DIAGNOSIS — Z1211 Encounter for screening for malignant neoplasm of colon: Secondary | ICD-10-CM | POA: Diagnosis not present

## 2020-05-12 DIAGNOSIS — Z0000 Encounter for general adult medical examination without abnormal findings: Secondary | ICD-10-CM | POA: Diagnosis not present

## 2020-05-12 DIAGNOSIS — E039 Hypothyroidism, unspecified: Secondary | ICD-10-CM | POA: Diagnosis not present

## 2020-05-12 DIAGNOSIS — Z1389 Encounter for screening for other disorder: Secondary | ICD-10-CM | POA: Diagnosis not present

## 2020-06-17 DIAGNOSIS — M316 Other giant cell arteritis: Secondary | ICD-10-CM | POA: Diagnosis not present

## 2020-06-17 DIAGNOSIS — Z7952 Long term (current) use of systemic steroids: Secondary | ICD-10-CM | POA: Diagnosis not present

## 2020-06-17 DIAGNOSIS — M797 Fibromyalgia: Secondary | ICD-10-CM | POA: Diagnosis not present

## 2020-06-17 DIAGNOSIS — K219 Gastro-esophageal reflux disease without esophagitis: Secondary | ICD-10-CM | POA: Diagnosis not present

## 2020-06-17 DIAGNOSIS — M810 Age-related osteoporosis without current pathological fracture: Secondary | ICD-10-CM | POA: Diagnosis not present

## 2020-06-17 DIAGNOSIS — R519 Headache, unspecified: Secondary | ICD-10-CM | POA: Diagnosis not present

## 2020-06-17 DIAGNOSIS — R700 Elevated erythrocyte sedimentation rate: Secondary | ICD-10-CM | POA: Diagnosis not present

## 2020-06-26 DIAGNOSIS — Z1212 Encounter for screening for malignant neoplasm of rectum: Secondary | ICD-10-CM | POA: Diagnosis not present

## 2020-06-26 DIAGNOSIS — Z1211 Encounter for screening for malignant neoplasm of colon: Secondary | ICD-10-CM | POA: Diagnosis not present

## 2020-07-05 LAB — COLOGUARD: COLOGUARD: NEGATIVE

## 2020-07-05 LAB — EXTERNAL GENERIC LAB PROCEDURE: COLOGUARD: NEGATIVE

## 2020-07-15 DIAGNOSIS — E039 Hypothyroidism, unspecified: Secondary | ICD-10-CM | POA: Diagnosis not present

## 2020-08-02 DIAGNOSIS — H35371 Puckering of macula, right eye: Secondary | ICD-10-CM | POA: Diagnosis not present

## 2020-08-02 DIAGNOSIS — H43811 Vitreous degeneration, right eye: Secondary | ICD-10-CM | POA: Diagnosis not present

## 2020-08-02 DIAGNOSIS — H35342 Macular cyst, hole, or pseudohole, left eye: Secondary | ICD-10-CM | POA: Diagnosis not present

## 2020-08-02 DIAGNOSIS — H43391 Other vitreous opacities, right eye: Secondary | ICD-10-CM | POA: Diagnosis not present

## 2020-08-15 DIAGNOSIS — H33311 Horseshoe tear of retina without detachment, right eye: Secondary | ICD-10-CM | POA: Diagnosis not present

## 2020-08-15 DIAGNOSIS — H35371 Puckering of macula, right eye: Secondary | ICD-10-CM | POA: Diagnosis not present

## 2020-08-15 DIAGNOSIS — H33321 Round hole, right eye: Secondary | ICD-10-CM | POA: Diagnosis not present

## 2020-08-16 DIAGNOSIS — H4311 Vitreous hemorrhage, right eye: Secondary | ICD-10-CM | POA: Diagnosis not present

## 2020-08-16 DIAGNOSIS — H33311 Horseshoe tear of retina without detachment, right eye: Secondary | ICD-10-CM | POA: Diagnosis not present

## 2020-08-23 DIAGNOSIS — H35371 Puckering of macula, right eye: Secondary | ICD-10-CM | POA: Diagnosis not present

## 2020-08-23 DIAGNOSIS — H33311 Horseshoe tear of retina without detachment, right eye: Secondary | ICD-10-CM | POA: Diagnosis not present

## 2020-09-07 DIAGNOSIS — H353121 Nonexudative age-related macular degeneration, left eye, early dry stage: Secondary | ICD-10-CM | POA: Diagnosis not present

## 2020-09-07 DIAGNOSIS — H31003 Unspecified chorioretinal scars, bilateral: Secondary | ICD-10-CM | POA: Diagnosis not present

## 2020-09-07 DIAGNOSIS — Z961 Presence of intraocular lens: Secondary | ICD-10-CM | POA: Diagnosis not present

## 2020-09-07 DIAGNOSIS — H524 Presbyopia: Secondary | ICD-10-CM | POA: Diagnosis not present

## 2020-09-13 DIAGNOSIS — H31093 Other chorioretinal scars, bilateral: Secondary | ICD-10-CM | POA: Diagnosis not present

## 2020-09-13 DIAGNOSIS — H35371 Puckering of macula, right eye: Secondary | ICD-10-CM | POA: Diagnosis not present

## 2020-10-12 DIAGNOSIS — E039 Hypothyroidism, unspecified: Secondary | ICD-10-CM | POA: Diagnosis not present

## 2020-12-12 ENCOUNTER — Ambulatory Visit (HOSPITAL_COMMUNITY)
Admission: EM | Admit: 2020-12-12 | Discharge: 2020-12-12 | Disposition: A | Discharge status: Home / Self Care | Payer: PPO

## 2020-12-12 ENCOUNTER — Encounter (HOSPITAL_COMMUNITY): Payer: Self-pay | Admitting: Emergency Medicine

## 2020-12-12 ENCOUNTER — Ambulatory Visit (INDEPENDENT_AMBULATORY_CARE_PROVIDER_SITE_OTHER): Payer: PPO

## 2020-12-12 ENCOUNTER — Other Ambulatory Visit: Payer: Self-pay

## 2020-12-12 DIAGNOSIS — R062 Wheezing: Secondary | ICD-10-CM | POA: Diagnosis not present

## 2020-12-12 DIAGNOSIS — R059 Cough, unspecified: Secondary | ICD-10-CM | POA: Diagnosis not present

## 2020-12-12 DIAGNOSIS — J40 Bronchitis, not specified as acute or chronic: Secondary | ICD-10-CM

## 2020-12-12 DIAGNOSIS — R0789 Other chest pain: Secondary | ICD-10-CM | POA: Diagnosis not present

## 2020-12-12 MED ORDER — ALBUTEROL SULFATE HFA 108 (90 BASE) MCG/ACT IN AERS: 2.0000 | INHALATION_SPRAY | RESPIRATORY_TRACT | 0 refills | Status: DC | PRN

## 2020-12-12 NOTE — ED Triage Notes (Signed)
Pt presents with cough, congestion, wheezing and chest tightness. States was prescribed doxycyline by PCP for sinus infection and has completed antibiotic. Was told by PCP to come and get chest xray for possible pneumonia.

## 2020-12-12 NOTE — ED Provider Notes (Signed)
[facility]    CSN: [id number] Arrival date & time: 12/12/20  1357      History   Chief Complaint Chief Complaint  Patient presents with  . Cough  . Nasal Congestion  . Wheezing    HPI Alyssa Grant is a 70 y.o. female.   Patient presents with congestion, productive cough with varying colored sputum, wheezing heard by family on 2/13 and chest tightness/discomfort during inhalation for 3-4 days. Recently treated for sinus infection, completed antibiotic course. Has history of seasonal allergies, not taking medication   Past Medical History:  Diagnosis Date  . Allergy    Claritin; seasonal.  . Arthritis    Hands, hips, knees.  Ibuprofen PRN.  . Fibromyalgia    dx by [facility]  . Hypothyroidism    goiter; onset age 64.  . Osteoporosis     Patient Active Problem List   Diagnosis Date Noted  . Fibromyalgia 01/02/2017  . Osteoporosis 12/30/2015  . Underweight 12/28/2014  . Thyroid activity decreased 12/28/2014    Past Surgical History:  Procedure Laterality Date  . BREAST SURGERY     breast cyst resection; benign.  x 2.    OB History   No obstetric history on file.      Home Medications    Prior to Admission medications   Medication Sig Start Date End Date Taking? Authorizing Provider  doxycycline (VIBRAMYCIN) 100 MG capsule Take 1 capsule (100 mg total) by mouth 2 (two) times daily. Patient not taking: Reported on 12/04/2018 03/06/18   Benjiman Core D, PA-C  ferrous sulfate 325 (65 FE) MG tablet Take 325 mg by mouth as needed.     [provider]  levothyroxine (SYNTHROID, LEVOTHROID) 75 MCG tablet Take 1 tablet (75 mcg total) by mouth daily before breakfast. 01/22/19   Sagardia, Eilleen Kempf, MD  liothyronine (CYTOMEL) 25 MCG tablet Take 1 tablet (25 mcg total) by mouth daily. 01/22/19   Georgina Quint, MD  Multiple Vitamin (MULTIVITAMIN) tablet Take 1 tablet by mouth daily.    [provider]  omeprazole (PRILOSEC)  40 MG capsule Take 40 mg by mouth daily.    [provider]  OVER THE COUNTER MEDICATION OTC Fish Oil 320 mg EPA, 200 mg DHA taking daily    [provider]  OVER THE COUNTER MEDICATION Magnesium Malate 1250 mg taking daily    [provider]  OVER THE COUNTER MEDICATION Vitamin D 2000 iu taking daily    [provider]  OVER THE COUNTER MEDICATION Biotin 5000 mcg taking daily    [provider]  POTASSIUM CHLORIDE PO Take 550 mg by mouth 2 (two) times daily.    [provider]  predniSONE (DELTASONE) 1 MG tablet Take 2 mg by mouth daily. 12/01/20   [provider]  predniSONE (DELTASONE) 50 MG tablet Take 1 tablet (50 mg total) by mouth daily with breakfast. 04/28/18   Benjiman Core D, PA-C  vitamin E 400 UNIT capsule Take 400 Units by mouth daily.    [provider]    Family History Family History  Problem Relation Age of Onset  . Heart disease Mother 74       AMI x 2  . Hyperlipidemia Mother   . COPD Mother   . Stroke Mother 58  . Cancer Father        prostate cancer; lung cancer  . Heart disease Father   . Hyperlipidemia Father   . Hypertension Father   .  Alcohol abuse Father   . Cancer Sister 20       bone cancer? s/p radiation and chemotherapy  . Hyperlipidemia Sister   . Hypertension Sister   . Diabetes Brother   . Hyperlipidemia Brother   . Hypertension Brother   . Stroke Brother   . Heart disease Brother   . Hyperlipidemia Brother   . Hypertension Brother   . Stroke Brother   . Diabetes Sister   . Hyperlipidemia Sister   . Hypertension Sister   . Heart disease Sister 52       CABG/CAD  . Anxiety disorder Sister     Social History Social History   Tobacco Use  . Smoking status: Never Smoker  . Smokeless tobacco: Never Used  Vaping Use  . Vaping Use: Never used  Substance Use Topics  . Alcohol use: No    Alcohol/week: 0.0 standard drinks  . Drug use: No     Allergies   Levaquin  [levofloxacin], Penicillins, and Sulfa antibiotics   Review of Systems Review of Systems  Constitutional: Positive for fatigue. Negative for activity change, appetite change, chills, diaphoresis, fever and unexpected weight change.  HENT: Positive for congestion and postnasal drip. Negative for dental problem, drooling, ear discharge, ear pain, facial swelling, hearing loss, mouth sores, nosebleeds, rhinorrhea, sinus pressure, sinus pain, sneezing, sore throat, tinnitus, trouble swallowing and voice change.   Respiratory: Positive for cough and wheezing. Negative for apnea, choking, chest tightness, shortness of breath and stridor.   Cardiovascular: Negative.   Gastrointestinal: Negative.   Skin: Negative.   Allergic/Immunologic: Positive for environmental allergies. Negative for food allergies and immunocompromised state.  Neurological: Negative.  Negative for tremors.  Psychiatric/Behavioral: Negative.      Physical Exam Triage Vital Signs ED Triage Vitals  Enc Vitals Group     BP 12/12/20 1431 128/86     Pulse Rate 12/12/20 1431 (!) 101     Resp 12/12/20 1431 18     Temp 12/12/20 1431 98 F (36.7 C)     Temp Source 12/12/20 1431 Oral     SpO2 12/12/20 1431 95 %     Weight --      Height --      Head Circumference --      Peak Flow --      Pain Score 12/12/20 1428 0     Pain Loc --      Pain Edu? --      Excl. in GC? --    No data found.  Updated Vital Signs BP 128/86 (BP Location: Right Arm)   Pulse (!) 101   Temp 98 F (36.7 C) (Oral)   Resp 18   SpO2 95%   Visual Acuity Right Eye Distance:   Left Eye Distance:   Bilateral Distance:    Right Eye Near:   Left Eye Near:    Bilateral Near:     Physical Exam Constitutional:      Appearance: Normal appearance. She is normal weight.  HENT:     Head: Normocephalic.     Nose: Congestion present. No rhinorrhea.     Right Turbinates: Pale. Not swollen.     Left Turbinates: Pale. Not swollen.     Right Sinus:  No maxillary sinus tenderness or frontal sinus tenderness.     Left Sinus: No maxillary sinus tenderness or frontal sinus tenderness.     Mouth/Throat:     Mouth: Mucous membranes are moist.     Pharynx: Oropharynx is  clear.  Eyes:     Extraocular Movements: Extraocular movements intact.  Cardiovascular:     Rate and Rhythm: Normal rate and regular rhythm.     Pulses: Normal pulses.     Heart sounds: Normal heart sounds.  Pulmonary:     Effort: Pulmonary effort is normal.     Breath sounds: Normal breath sounds.  Musculoskeletal:        General: Normal range of motion.     Cervical back: Normal range of motion and neck supple.  Skin:    General: Skin is warm and dry.  Neurological:     General: No focal deficit present.     Mental Status: She is alert and oriented to person, place, and time. Mental status is at baseline.  Psychiatric:        Mood and Affect: Mood normal.        Behavior: Behavior normal.        Thought Content: Thought content normal.        Judgment: Judgment normal.      UC Treatments / Results  Labs (all labs ordered are listed, but only abnormal results are displayed) Labs Reviewed - No data to display  EKG   Radiology DG Chest 2 View  Result Date: 12/12/2020 CLINICAL DATA:  Cough, congestion, wheezing and chest tightness. EXAM: CHEST - 2 VIEW COMPARISON:  None. FINDINGS: Lungs clear. Heart size normal. No pneumothorax or pleural effusion. No acute bony abnormality. Pectus excavatum deformity noted. IMPRESSION: No acute disease. Electronically Signed   By: Drusilla Kanner M.D.   On: 12/12/2020 14:52    Procedures Procedures (including critical care time)  Medications Ordered in UC Medications - No data to display  Initial Impression / Assessment and Plan / UC Course  I have reviewed the triage vital signs and the nursing notes.  Pertinent labs & imaging results that were available during my care of the patient were reviewed by me and  considered in my medical decision making (see chart for details).  Bronchitis  1. Chest x-ray- negative 2. Albuterol 2 puffs every 4-6 hours as needed 3. Claritin 10 mg daily 4. Flonase 2 puffs each nostril twice a day  Final Clinical Impressions(s) / UC Diagnoses   Final diagnoses:  None   Discharge Instructions   None    ED Prescriptions    None     PDMP not reviewed this encounter.   Valinda Hoar, NP 12/12/20 1531

## 2020-12-12 NOTE — Discharge Instructions (Signed)
Take 2 puffs of inhaler every 4-6 hours as needed  Can restart OTC Claritin for a month   Can use Flonase nasal spray twice a day

## 2020-12-19 DIAGNOSIS — M316 Other giant cell arteritis: Secondary | ICD-10-CM | POA: Diagnosis not present

## 2020-12-19 DIAGNOSIS — M797 Fibromyalgia: Secondary | ICD-10-CM | POA: Diagnosis not present

## 2020-12-19 DIAGNOSIS — M810 Age-related osteoporosis without current pathological fracture: Secondary | ICD-10-CM | POA: Diagnosis not present

## 2020-12-19 DIAGNOSIS — R519 Headache, unspecified: Secondary | ICD-10-CM | POA: Diagnosis not present

## 2020-12-19 DIAGNOSIS — K219 Gastro-esophageal reflux disease without esophagitis: Secondary | ICD-10-CM | POA: Diagnosis not present

## 2020-12-19 DIAGNOSIS — Z7952 Long term (current) use of systemic steroids: Secondary | ICD-10-CM | POA: Diagnosis not present

## 2021-03-03 DIAGNOSIS — Z20822 Contact with and (suspected) exposure to covid-19: Secondary | ICD-10-CM | POA: Diagnosis not present

## 2021-03-03 DIAGNOSIS — R0989 Other specified symptoms and signs involving the circulatory and respiratory systems: Secondary | ICD-10-CM | POA: Diagnosis not present

## 2021-05-29 DIAGNOSIS — Z1159 Encounter for screening for other viral diseases: Secondary | ICD-10-CM | POA: Diagnosis not present

## 2021-05-29 DIAGNOSIS — Z0000 Encounter for general adult medical examination without abnormal findings: Secondary | ICD-10-CM | POA: Diagnosis not present

## 2021-05-29 DIAGNOSIS — Z1389 Encounter for screening for other disorder: Secondary | ICD-10-CM | POA: Diagnosis not present

## 2021-05-29 DIAGNOSIS — E039 Hypothyroidism, unspecified: Secondary | ICD-10-CM | POA: Diagnosis not present

## 2021-05-29 DIAGNOSIS — E7800 Pure hypercholesterolemia, unspecified: Secondary | ICD-10-CM | POA: Diagnosis not present

## 2021-05-29 DIAGNOSIS — M316 Other giant cell arteritis: Secondary | ICD-10-CM | POA: Diagnosis not present

## 2021-06-07 DIAGNOSIS — H524 Presbyopia: Secondary | ICD-10-CM | POA: Diagnosis not present

## 2021-06-07 DIAGNOSIS — Z961 Presence of intraocular lens: Secondary | ICD-10-CM | POA: Diagnosis not present

## 2021-06-07 DIAGNOSIS — H353121 Nonexudative age-related macular degeneration, left eye, early dry stage: Secondary | ICD-10-CM | POA: Diagnosis not present

## 2021-06-19 DIAGNOSIS — M797 Fibromyalgia: Secondary | ICD-10-CM | POA: Diagnosis not present

## 2021-06-19 DIAGNOSIS — M316 Other giant cell arteritis: Secondary | ICD-10-CM | POA: Diagnosis not present

## 2021-06-19 DIAGNOSIS — R519 Headache, unspecified: Secondary | ICD-10-CM | POA: Diagnosis not present

## 2021-06-19 DIAGNOSIS — Z7952 Long term (current) use of systemic steroids: Secondary | ICD-10-CM | POA: Diagnosis not present

## 2021-06-19 DIAGNOSIS — M810 Age-related osteoporosis without current pathological fracture: Secondary | ICD-10-CM | POA: Diagnosis not present

## 2021-06-19 DIAGNOSIS — K219 Gastro-esophageal reflux disease without esophagitis: Secondary | ICD-10-CM | POA: Diagnosis not present

## 2021-07-31 DIAGNOSIS — E039 Hypothyroidism, unspecified: Secondary | ICD-10-CM | POA: Diagnosis not present

## 2021-12-25 DIAGNOSIS — Z8249 Family history of ischemic heart disease and other diseases of the circulatory system: Secondary | ICD-10-CM | POA: Diagnosis not present

## 2021-12-25 DIAGNOSIS — M810 Age-related osteoporosis without current pathological fracture: Secondary | ICD-10-CM | POA: Diagnosis not present

## 2021-12-25 DIAGNOSIS — M797 Fibromyalgia: Secondary | ICD-10-CM | POA: Diagnosis not present

## 2021-12-25 DIAGNOSIS — E039 Hypothyroidism, unspecified: Secondary | ICD-10-CM | POA: Diagnosis not present

## 2021-12-25 DIAGNOSIS — R519 Headache, unspecified: Secondary | ICD-10-CM | POA: Diagnosis not present

## 2021-12-25 DIAGNOSIS — M316 Other giant cell arteritis: Secondary | ICD-10-CM | POA: Diagnosis not present

## 2021-12-25 DIAGNOSIS — Z7952 Long term (current) use of systemic steroids: Secondary | ICD-10-CM | POA: Diagnosis not present

## 2021-12-25 DIAGNOSIS — K219 Gastro-esophageal reflux disease without esophagitis: Secondary | ICD-10-CM | POA: Diagnosis not present

## 2022-01-08 ENCOUNTER — Ambulatory Visit: Payer: PPO | Admitting: Cardiology

## 2022-01-19 ENCOUNTER — Inpatient Hospital Stay: Payer: PPO

## 2022-01-19 ENCOUNTER — Other Ambulatory Visit: Payer: Self-pay

## 2022-01-19 ENCOUNTER — Encounter: Payer: Self-pay | Admitting: Cardiology

## 2022-01-19 ENCOUNTER — Ambulatory Visit: Payer: PPO | Admitting: Cardiology

## 2022-01-19 VITALS — BP 122/67 | HR 90 | Temp 98.3°F | Resp 16 | Ht 61.0 in | Wt 100.0 lb

## 2022-01-19 DIAGNOSIS — Z8249 Family history of ischemic heart disease and other diseases of the circulatory system: Secondary | ICD-10-CM | POA: Insufficient documentation

## 2022-01-19 DIAGNOSIS — R002 Palpitations: Secondary | ICD-10-CM | POA: Diagnosis not present

## 2022-01-19 NOTE — Progress Notes (Signed)
? ? ?Patient referred by Carol Ada, MD for screening for heart disease ? ?Subjective:  ? ?Alyssa Grant, female    DOB: 1951/09/22, 71 y.o.   MRN: [id number] ? ? ?Chief Complaint  ?Patient presents with  ? family hx heart disease  ? New Patient (Initial Visit)  ? ? ? ?HPI ? ?71 y.o. Caucasian female with h/o giant cell arteritis, hypothyroidism, strong family history of coronary artery disease, referred for screening for cardiac disease ? ?Patient is widowed, lives alone.  She stays active with 2 mile walks and yard work.  At the start of activity, she does not have any chest pain, shortness of breath symptoms.  She has occasional difficulty taking deep breath, only while brushing.  She does endorse that she is a "mild breather, and has seasonal allergies.  Patient reports palpitations couple times a week, lasting for 5-10 minutes, associated with occasional lightheadedness. ? ?In April 2022, patient was diagnosed with giant cell arteritis.  She was started on prednisone 60 mg daily, which is now weaned down to maintenance dose of 2 mg daily.  She also takes aspirin 81 mg daily.  This is managed by rheumatologist Dr.Aryal. ? ?Patient has had hypothyroidism since age 72.  However, her levels have been variable, with TSH and free T4 being low, T3 being high.  She is on both levothyroxine and liothyroxine.  She is scheduled to see endocrinologist Dr. Soyla Murphy for consultation visit in April 2023. ? ?Patient has strong family history of coronary artery disease.  All her grandparents had coronary artery disease.  Father had CABG in 57s, mother had MI in 9s.  Older brother had MI in 19s, younger brother had MI in 24s.  Sister had CABG in 71s.  Another sister had fatal MI in 73s. ? ? ?Past Medical History:  ?Diagnosis Date  ? Allergy   ? Claritin; seasonal.  ? Arthritis   ? Hands, hips, knees.  Ibuprofen PRN.  ? Fibromyalgia   ? dx by [facility]  ? Hypothyroidism   ? goiter; onset age 27.  ? Osteoporosis   ? RMSF  [facility] spotted fever)   ? 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 ? ? ?Past Surgical History:  ?Procedure Laterality Date  ? BREAST SURGERY    ? breast cyst resection; benign.  x 2.  ? ? ? ?Social History  ? ?Tobacco Use  ?Smoking Status Never  ?Smokeless Tobacco Never  ? ? ?Social History  ? ?Substance and Sexual Activity  ?Alcohol Use No  ? Alcohol/week: 0.0 standard drinks  ? ? ? ?Family History  ?Problem Relation Age of Onset  ? Heart disease Mother 6  ?     AMI x 2  ? Hyperlipidemia Mother   ? COPD Mother   ? Stroke Mother 69  ? Cancer Father   ?     prostate cancer; lung cancer  ? Heart disease Father   ? Hyperlipidemia Father   ? Hypertension Father   ? Alcohol abuse Father   ? Cancer Sister 32  ?     bone cancer? s/p radiation and chemotherapy  ? Hyperlipidemia Sister   ? Hypertension Sister   ? Diabetes Brother   ? Hyperlipidemia Brother   ? Hypertension Brother   ? Stroke Brother   ? Heart disease Brother   ? Hyperlipidemia Brother   ? Hypertension Brother   ? Stroke Brother   ? Diabetes Sister   ? Hyperlipidemia Sister   ? Hypertension  Sister   ? Heart disease Sister 30  ?     CABG/CAD  ? Anxiety disorder Sister   ? ? ? ? ?Current Outpatient Medications:  ?  aspirin 81 MG chewable tablet, , Disp: , Rfl:  ?  ferrous sulfate 325 (65 FE) MG tablet, Take 325 mg by mouth as needed. , Disp: , Rfl:  ?  levothyroxine (SYNTHROID, LEVOTHROID) 75 MCG tablet, Take 1 tablet (75 mcg total) by mouth daily before breakfast., Disp: 90 tablet, Rfl: 3 ?  liothyronine (CYTOMEL) 25 MCG tablet, Take 1 tablet (25 mcg total) by mouth daily., Disp: 90 tablet, Rfl: 3 ?  Multiple Vitamin (MULTIVITAMIN) tablet, Take 1 tablet by mouth daily., Disp: , Rfl:  ?  Multiple Vitamins-Minerals (PRESERVISION AREDS) CAPS, See admin instructions., Disp: , Rfl:  ?  OVER THE COUNTER MEDICATION, OTC Fish Oil 320 mg EPA, 200 mg DHA taking daily, Disp: , Rfl:  ?  OVER THE COUNTER MEDICATION, Magnesium Malate 1250 mg taking daily, Disp: ,  Rfl:  ?  OVER THE COUNTER MEDICATION, Vitamin D 2000 iu taking daily, Disp: , Rfl:  ?  POTASSIUM CHLORIDE PO, Take 550 mg by mouth 2 (two) times daily., Disp: , Rfl:  ?  predniSONE (DELTASONE) 1 MG tablet, Take 2 mg by mouth daily., Disp: , Rfl:  ?  predniSONE (DELTASONE) 50 MG tablet, Take 1 tablet (50 mg total) by mouth daily with breakfast., Disp: 14 tablet, Rfl: 0 ?  vitamin E 180 MG (400 UNITS) capsule, 1 capsule, Disp: , Rfl:  ?  vitamin E 400 UNIT capsule, Take 400 Units by mouth daily., Disp: , Rfl:  ? ? ?Cardiovascular and other pertinent studies: ? ?Reviewed external labs and tests, independently interpreted ? ?EKG 01/19/2022: ?Sinus rhythm 83 bpm ?Borderline left atrial enlargement ?Otherwose normal EKG ? ? ?Recent labs: ?05/29/2021: ?Glucose 84, BUN/Cr 14/0.65. EGFR 94. Na/K 138/4.5. T. Bili 1.2. Rest of the CMP normal ?H/H 13/42. MCV 95. Platelets 157 ?HbA1C NA ?Chol 212, TG 35, HDL 89, LDL 117 ?TSH 0. 33 low (0.34-4.5) ?Free T4 0.55 low (0.61-1.12) ?Free T3 272 high (71-180) ? ? ? ? ?Review of Systems  ?Cardiovascular:  Positive for palpitations. Negative for chest pain, dyspnea on exertion, leg swelling and syncope.  ? ?   ? ? ?Vitals:  ? 01/19/22 0848 01/19/22 0849  ?BP: (!) 125/26 122/67  ?Pulse: 93 90  ?Resp: 16   ?Temp: 98.3 ?F (36.8 ?C)   ?SpO2: 98%   ? ?125/26 mmHg reading is likely erroneous ? ?Body mass index is 18.89 kg/m?. Danley Danker Weights  ? 01/19/22 0848  ?Weight: 100 lb (45.4 kg)  ? ? ? ?Objective:  ? Physical Exam ?Vitals and nursing note reviewed.  ?Constitutional:   ?   General: She is not in acute distress. ?   Appearance: She is underweight.  ?Neck:  ?   Vascular: No JVD.  ?Cardiovascular:  ?   Rate and Rhythm: Normal rate and regular rhythm.  ?   Heart sounds: Normal heart sounds. No murmur heard. ?Pulmonary:  ?   Effort: Pulmonary effort is normal.  ?   Breath sounds: Normal breath sounds. No wheezing or rales.  ?Musculoskeletal:  ?   Right lower leg: No edema.  ?   Left lower leg: No  edema.  ? ? ? ?   ? ?Visit diagnoses: ?  ICD-10-CM   ?1. Family history of early CAD  Z82.49 EKG 12-Lead  ?  PCV CARDIAC STRESS TEST  ?  CT CARDIAC SCORING ([facility] LOCATIONS ONLY)  ?  Lipoprotein A (LPA)  ?  ?2. Palpitations  R00.2 LONG TERM MONITOR (3-14 DAYS)  ?  ?  ? ?Orders Placed This Encounter  ?Procedures  ? CT CARDIAC SCORING ([facility] LOCATIONS ONLY)  ? Lipoprotein A (LPA)  ? PCV CARDIAC STRESS TEST  ? LONG TERM MONITOR (3-14 DAYS)  ? EKG 12-Lead  ?  ? ? ?Assessment & Recommendations:  ? ?71 y.o. Caucasian female with h/o giant cell arteritis, hypothyroidism, strong family history of coronary artery disease, referred for screening for cardiac disease ? ?Family history of coronary artery disease: ?No symptoms just of coronary artery disease at this time.  Recommend screening with calcium score scan, exercise treadmill stress test, and lipoprotein a. ?Continue heart healthy diet and lifestyle. ? ?Palpitations: ?Frequent episodes lasting for 5-10 minutes, associated with lightheadedness.  Recommend 2-week cardiac telemetry. ? ?Further recommendations after above testing. ? ? ?Thank you for referring the patient to [place]. Please feel free to contact with any questions. ? ? ?Nigel Mormon, MD ?Pager: [phone] ?Office: [phone] ?

## 2022-02-12 DIAGNOSIS — R002 Palpitations: Secondary | ICD-10-CM | POA: Diagnosis not present

## 2022-02-14 DIAGNOSIS — M810 Age-related osteoporosis without current pathological fracture: Secondary | ICD-10-CM | POA: Diagnosis not present

## 2022-02-14 DIAGNOSIS — E039 Hypothyroidism, unspecified: Secondary | ICD-10-CM | POA: Diagnosis not present

## 2022-02-16 ENCOUNTER — Ambulatory Visit
Admission: RE | Admit: 2022-02-16 | Discharge: 2022-02-16 | Disposition: A | Discharge status: Home / Self Care | Payer: No Typology Code available for payment source | Source: Ambulatory Visit | Attending: Cardiology | Admitting: Cardiology

## 2022-02-16 DIAGNOSIS — J439 Emphysema, unspecified: Secondary | ICD-10-CM | POA: Diagnosis not present

## 2022-02-16 DIAGNOSIS — Z8249 Family history of ischemic heart disease and other diseases of the circulatory system: Secondary | ICD-10-CM

## 2022-02-19 DIAGNOSIS — Z8249 Family history of ischemic heart disease and other diseases of the circulatory system: Secondary | ICD-10-CM | POA: Diagnosis not present

## 2022-02-20 LAB — LIPOPROTEIN A (LPA): Lipoprotein (a): 40.7 nmol/L (ref <75.0)

## 2022-02-23 ENCOUNTER — Ambulatory Visit: Payer: PPO

## 2022-02-23 DIAGNOSIS — Z8249 Family history of ischemic heart disease and other diseases of the circulatory system: Secondary | ICD-10-CM | POA: Diagnosis not present

## 2022-02-23 DIAGNOSIS — R002 Palpitations: Secondary | ICD-10-CM | POA: Diagnosis not present

## 2022-02-26 DIAGNOSIS — R002 Palpitations: Secondary | ICD-10-CM | POA: Diagnosis not present

## 2022-02-26 NOTE — Progress Notes (Signed)
No significant KEG abnormalities on stress testing. Few episodes of rapid heart beat, short lasting, likely cause of palpitations. Consider low dose metoprolol tartrate 12.5 mg twice daily.  Her appt with me may be rescheduled as I will be out. If patient okay, please send prescription. May reschedule to June or July. ? ?Thanks ?MJP ? ?

## 2022-02-27 NOTE — Progress Notes (Signed)
Patient has been rescheduled for 03/09/2022 to see you and she will talk to you about starting new medications and her Afib then.

## 2022-02-27 NOTE — Progress Notes (Signed)
Called patient, Alyssa Grant, Alyssa Grant

## 2022-02-27 NOTE — Progress Notes (Signed)
Called and spoke with patient regarding her stress test results. Patient does not want to start the Metoprolol because she wants to talks to you before starting.  ? ?Patient wants to know if she can be seen today or tomorrow, cannot wait until June because she had an episode of Afib about an hour after she had the stress test done. Please advise.

## 2022-02-27 NOTE — Progress Notes (Signed)
No documented Afib seen by me. If patient has any tracings (smart watch etc), please bring to the visit on 5/12. ? ?Thanks ?MJP ? ?

## 2022-02-28 NOTE — Progress Notes (Signed)
Home phone: Called patient, NA, LMAM  ?Mobile # : Called NA, LMAM

## 2022-02-28 NOTE — Progress Notes (Signed)
Patient called back and she has only "felt" the Afib, But has never been told, and says that we had her wear a monitor. I told her we would discuss all that with her at the next OV.

## 2022-03-01 ENCOUNTER — Ambulatory Visit: Payer: PPO | Admitting: Cardiology

## 2022-03-06 NOTE — Progress Notes (Signed)
? ? ?Patient referred by Carol Ada, MD for screening for heart disease ? ?Subjective:  ? ?Alyssa Grant, female    DOB: [birth year], 71 y.o.   MRN: [id number] ? ? ?Chief Complaint  ?Patient presents with  ? Follow-up  ? Results  ? ? ? ?HPI ? ?71 y.o. Caucasian female with h/o giant cell arteritis, hypothyroidism, strong family history of coronary artery disease, referred for screening for cardiac disease ? ?Patient is doing well.  After her stress test, patient experienced episodes of what she calls as "A-fib".  She describes them as palpitations lasting for few minutes, mainly perceived as intermittent pauses, occasionally associated with dizziness.  Symptoms neuromuscular then 10-15-minute.  Recently, her endocrinologist Dr. Chalmers Cater changed her thyroid medication dosages 8 days ago..  Since then, her symptoms of palpitations have completely resolved. ? ?Reviewed recent test results with the patient, details below.  ? ? ?Initial consultation visit 12/2021: ?Patient is widowed, lives alone.  She stays active with 2 mile walks and yard work.  At the start of activity, she does not have any chest pain, shortness of breath symptoms.  She has occasional difficulty taking deep breath, only while brushing.  She does endorse that she is a "mild breather, and has seasonal allergies.  Patient reports palpitations couple times a week, lasting for 5-10 minutes, associated with occasional lightheadedness. ? ?In April 2022, patient was diagnosed with giant cell arteritis.  She was started on prednisone 60 mg daily, which is now weaned down to maintenance dose of 2 mg daily.  She also takes aspirin 81 mg daily.  This is managed by rheumatologist Dr.Aryal. ? ?Patient has had hypothyroidism since age 76.  However, her levels have been variable, with TSH and free T4 being low, T3 being high.  She is on both levothyroxine and liothyroxine.  She is scheduled to see endocrinologist Dr. Soyla Murphy for consultation visit in April  2023. ? ?Patient has strong family history of coronary artery disease.  All her grandparents had coronary artery disease.  Father had CABG in 57s, mother had MI in 64s.  Older brother had MI in 55s, younger brother had MI in 13s.  Sister had CABG in 16s.  Another sister had fatal MI in 68s. ? ? ?Current Outpatient Medications:  ?  aspirin 81 MG chewable tablet, , Disp: , Rfl:  ?  ferrous sulfate 325 (65 FE) MG tablet, Take 325 mg by mouth as needed. , Disp: , Rfl:  ?  levothyroxine (SYNTHROID, LEVOTHROID) 75 MCG tablet, Take 1 tablet (75 mcg total) by mouth daily before breakfast., Disp: 90 tablet, Rfl: 3 ?  liothyronine (CYTOMEL) 25 MCG tablet, Take 1 tablet (25 mcg total) by mouth daily., Disp: 90 tablet, Rfl: 3 ?  Multiple Vitamin (MULTIVITAMIN) tablet, Take 1 tablet by mouth daily., Disp: , Rfl:  ?  Multiple Vitamins-Minerals (PRESERVISION AREDS) CAPS, See admin instructions., Disp: , Rfl:  ?  OVER THE COUNTER MEDICATION, OTC Fish Oil 320 mg EPA, 200 mg DHA taking daily, Disp: , Rfl:  ?  OVER THE COUNTER MEDICATION, Magnesium Malate 1250 mg taking daily, Disp: , Rfl:  ?  OVER THE COUNTER MEDICATION, Vitamin D 2000 iu taking daily, Disp: , Rfl:  ?  POTASSIUM CHLORIDE PO, Take 550 mg by mouth 2 (two) times daily., Disp: , Rfl:  ?  predniSONE (DELTASONE) 1 MG tablet, Take 2 mg by mouth daily., Disp: , Rfl:  ?  predniSONE (DELTASONE) 50 MG tablet, Take 1 tablet (50 mg total)  by mouth daily with breakfast., Disp: 14 tablet, Rfl: 0 ?  vitamin E 180 MG (400 UNITS) capsule, 1 capsule, Disp: , Rfl:  ?  vitamin E 400 UNIT capsule, Take 400 Units by mouth daily., Disp: , Rfl:  ? ? ?Cardiovascular and other pertinent studies: ? ?Exercise treadmill stress test 02/23/2022: ?Exercise treadmill stress test performed using Bruce protocol.  Patient reached 5.7 METS, and 115% of age predicted maximum heart rate.  Exercise capacity was low.  No chest pain reported.  Exaggerated heart rate response, normal blood pressure response.  Stress EKG revealed no ischemic changes. ?Low risk study. ? ?Reviewed external labs and tests, independently interpreted ? ?CT cardiac scoring 02/16/2022: ?Calcium score 0 ?Emphysema ? ?Mobile cardiac telemetry 14 days 01/19/2022 - 02/02/2022: ?Dominant rhythm: Sinus. ?HR 51-159 bpm. Avg HR 88 bpm, in sinus rhythm. ?63 episodes of SVT, fastest at 210 bpm for 7 beats, longest for 19 beats at 155 bpm. ?3.9% isolated SVE, <1% couplet/triplets. ?0 episodes of VT ?<1% isolated VE, couplets ?No atrial fibrillation/atrial flutter/VT/high grade AV block, sinus pause >3sec noted. ?26 patient triggered events, correlated with SVE/VE/SVT. ? ?EKG 01/19/2022: ?Sinus rhythm 83 bpm ?Borderline left atrial enlargement ?Otherwose normal EKG ? ? ?Recent labs: ?05/29/2021: ?Glucose 84, BUN/Cr 14/0.65. EGFR 94. Na/K 138/4.5. T. Bili 1.2. Rest of the CMP normal ?H/H 13/42. MCV 95. Platelets 157 ?HbA1C NA ?Chol 212, TG 35, HDL 89, LDL 117 ?TSH 0. 33 low (0.34-4.5) ?Free T4 0.55 low (0.61-1.12) ?Free T3 272 high (71-180) ? ? ? ? ?Review of Systems  ?Cardiovascular:  Positive for palpitations. Negative for chest pain, dyspnea on exertion, leg swelling and syncope.  ? ?   ?Today's Vitals  ? 03/09/22 0834  ?BP: 114/78  ?Pulse: 88  ?Resp: 17  ?Temp: 98.4 ?F (36.9 ?C)  ?TempSrc: Temporal  ?SpO2: 98%  ?Weight: 99 lb 6.4 oz (45.1 kg)  ?Height: 5' 1" (1.549 m)  ? ?Body mass index is 18.78 kg/m?. ? ? ? ? ?Objective:  ? Physical Exam ?Vitals and nursing note reviewed.  ?Constitutional:   ?   General: She is not in acute distress. ?   Appearance: She is underweight.  ?Neck:  ?   Vascular: No JVD.  ?Cardiovascular:  ?   Rate and Rhythm: Normal rate and regular rhythm.  ?   Heart sounds: Normal heart sounds. No murmur heard. ?Pulmonary:  ?   Effort: Pulmonary effort is normal.  ?   Breath sounds: Normal breath sounds. No wheezing or rales.  ?Musculoskeletal:  ?   Right lower leg: No edema.  ?   Left lower leg: No edema.  ? ? ? ?   ? ?Visit diagnoses: ?   ICD-10-CM   ?1. Palpitations  R00.2   ?  ?2. Atrial premature contractions  I49.1 diltiazem (CARDIZEM) 30 MG tablet  ?  ?3. Family history of early CAD  Z41.49   ?  ?  ? ?Meds ordered this encounter  ?Medications  ? diltiazem (CARDIZEM) 30 MG tablet  ?  Sig: Take 1 tablet (30 mg total) by mouth as needed.  ?  Dispense:  60 tablet  ?  Refill:  1  ? ? ? ?Assessment & Recommendations:  ? ?71 y.o. Caucasian female with h/o giant cell arteritis, hypothyroidism, strong family history of coronary artery disease ? ?Palpitations: ?Likely symptomatic PACs.  No objective evidence of A-fib.  Symptoms were likely driven by her abnormal thyroid findings, which have since improved.  Prescribed diltiazem 30 mg daily  for as needed use.  ? ?Incidental finding of emphysema on CT cardiac scoring likely clinically irrelevant in absence of symptoms.  She does report passive exposure to smoking through her parents.  No further work-up necessary at this time. ? ?Family history of coronary artery disease: ?Calcium score 0.  Reasonable to omit statin at this time. ?Regardless of absence of CAD, she is on aspirin for h/o giant cell arteritis.  Continue the same. ? ?F/u in 6 months ? ? ?Nigel Mormon, MD ?Pager: [phone] ?Office: [phone] ?

## 2022-03-09 ENCOUNTER — Ambulatory Visit: Payer: PPO | Admitting: Cardiology

## 2022-03-09 ENCOUNTER — Encounter: Payer: Self-pay | Admitting: Cardiology

## 2022-03-09 VITALS — BP 114/78 | HR 88 | Temp 98.4°F | Resp 17 | Ht 61.0 in | Wt 99.4 lb

## 2022-03-09 DIAGNOSIS — Z8249 Family history of ischemic heart disease and other diseases of the circulatory system: Secondary | ICD-10-CM

## 2022-03-09 DIAGNOSIS — I491 Atrial premature depolarization: Secondary | ICD-10-CM | POA: Insufficient documentation

## 2022-03-09 DIAGNOSIS — R002 Palpitations: Secondary | ICD-10-CM

## 2022-03-09 MED ORDER — DILTIAZEM HCL 30 MG PO TABS: 30.0000 mg | ORAL_TABLET | ORAL | 1 refills | Status: DC | PRN

## 2022-03-29 DIAGNOSIS — E039 Hypothyroidism, unspecified: Secondary | ICD-10-CM | POA: Diagnosis not present

## 2022-04-02 DIAGNOSIS — M810 Age-related osteoporosis without current pathological fracture: Secondary | ICD-10-CM | POA: Diagnosis not present

## 2022-04-25 ENCOUNTER — Other Ambulatory Visit: Payer: Self-pay | Admitting: Registered Nurse

## 2022-04-25 DIAGNOSIS — E039 Hypothyroidism, unspecified: Secondary | ICD-10-CM | POA: Diagnosis not present

## 2022-04-25 DIAGNOSIS — M316 Other giant cell arteritis: Secondary | ICD-10-CM | POA: Diagnosis not present

## 2022-04-25 DIAGNOSIS — Z1231 Encounter for screening mammogram for malignant neoplasm of breast: Secondary | ICD-10-CM

## 2022-04-25 DIAGNOSIS — Z7952 Long term (current) use of systemic steroids: Secondary | ICD-10-CM | POA: Diagnosis not present

## 2022-04-25 DIAGNOSIS — M810 Age-related osteoporosis without current pathological fracture: Secondary | ICD-10-CM | POA: Diagnosis not present

## 2022-04-25 DIAGNOSIS — Z8249 Family history of ischemic heart disease and other diseases of the circulatory system: Secondary | ICD-10-CM | POA: Diagnosis not present

## 2022-05-02 ENCOUNTER — Ambulatory Visit
Admission: RE | Admit: 2022-05-02 | Discharge: 2022-05-02 | Disposition: A | Discharge status: Home / Self Care | Payer: PPO | Source: Ambulatory Visit | Attending: Registered Nurse | Admitting: Registered Nurse

## 2022-05-02 DIAGNOSIS — Z1231 Encounter for screening mammogram for malignant neoplasm of breast: Secondary | ICD-10-CM | POA: Diagnosis not present

## 2022-06-19 DIAGNOSIS — H5211 Myopia, right eye: Secondary | ICD-10-CM | POA: Diagnosis not present

## 2022-06-19 DIAGNOSIS — Z961 Presence of intraocular lens: Secondary | ICD-10-CM | POA: Diagnosis not present

## 2022-06-19 DIAGNOSIS — H35362 Drusen (degenerative) of macula, left eye: Secondary | ICD-10-CM | POA: Diagnosis not present

## 2022-06-25 DIAGNOSIS — M810 Age-related osteoporosis without current pathological fracture: Secondary | ICD-10-CM | POA: Diagnosis not present

## 2022-06-25 DIAGNOSIS — Z7952 Long term (current) use of systemic steroids: Secondary | ICD-10-CM | POA: Diagnosis not present

## 2022-06-25 DIAGNOSIS — K219 Gastro-esophageal reflux disease without esophagitis: Secondary | ICD-10-CM | POA: Diagnosis not present

## 2022-06-25 DIAGNOSIS — M316 Other giant cell arteritis: Secondary | ICD-10-CM | POA: Diagnosis not present

## 2022-06-25 DIAGNOSIS — M797 Fibromyalgia: Secondary | ICD-10-CM | POA: Diagnosis not present

## 2022-07-13 DIAGNOSIS — E039 Hypothyroidism, unspecified: Secondary | ICD-10-CM | POA: Diagnosis not present

## 2022-07-13 DIAGNOSIS — R5383 Other fatigue: Secondary | ICD-10-CM | POA: Diagnosis not present

## 2022-07-20 DIAGNOSIS — Z0000 Encounter for general adult medical examination without abnormal findings: Secondary | ICD-10-CM | POA: Diagnosis not present

## 2022-07-20 DIAGNOSIS — E039 Hypothyroidism, unspecified: Secondary | ICD-10-CM | POA: Diagnosis not present

## 2022-07-20 DIAGNOSIS — Z8249 Family history of ischemic heart disease and other diseases of the circulatory system: Secondary | ICD-10-CM | POA: Diagnosis not present

## 2022-07-20 DIAGNOSIS — M810 Age-related osteoporosis without current pathological fracture: Secondary | ICD-10-CM | POA: Diagnosis not present

## 2022-07-20 DIAGNOSIS — M316 Other giant cell arteritis: Secondary | ICD-10-CM | POA: Diagnosis not present

## 2022-08-16 DIAGNOSIS — H31093 Other chorioretinal scars, bilateral: Secondary | ICD-10-CM | POA: Diagnosis not present

## 2022-08-16 DIAGNOSIS — E039 Hypothyroidism, unspecified: Secondary | ICD-10-CM | POA: Diagnosis not present

## 2022-08-16 DIAGNOSIS — M810 Age-related osteoporosis without current pathological fracture: Secondary | ICD-10-CM | POA: Diagnosis not present

## 2022-08-16 DIAGNOSIS — R5383 Other fatigue: Secondary | ICD-10-CM | POA: Diagnosis not present

## 2022-08-27 DIAGNOSIS — M67911 Unspecified disorder of synovium and tendon, right shoulder: Secondary | ICD-10-CM | POA: Diagnosis not present

## 2022-08-27 DIAGNOSIS — M19011 Primary osteoarthritis, right shoulder: Secondary | ICD-10-CM | POA: Diagnosis not present

## 2022-09-03 DIAGNOSIS — Z7952 Long term (current) use of systemic steroids: Secondary | ICD-10-CM | POA: Diagnosis not present

## 2022-09-03 DIAGNOSIS — K219 Gastro-esophageal reflux disease without esophagitis: Secondary | ICD-10-CM | POA: Diagnosis not present

## 2022-09-03 DIAGNOSIS — M810 Age-related osteoporosis without current pathological fracture: Secondary | ICD-10-CM | POA: Diagnosis not present

## 2022-09-10 ENCOUNTER — Ambulatory Visit: Payer: PPO | Admitting: Cardiology

## 2022-09-16 DIAGNOSIS — G8929 Other chronic pain: Secondary | ICD-10-CM | POA: Diagnosis not present

## 2022-09-16 DIAGNOSIS — E039 Hypothyroidism, unspecified: Secondary | ICD-10-CM | POA: Diagnosis not present

## 2022-09-16 DIAGNOSIS — M1990 Unspecified osteoarthritis, unspecified site: Secondary | ICD-10-CM | POA: Diagnosis not present

## 2022-09-16 DIAGNOSIS — K219 Gastro-esophageal reflux disease without esophagitis: Secondary | ICD-10-CM | POA: Diagnosis not present

## 2022-09-16 DIAGNOSIS — M316 Other giant cell arteritis: Secondary | ICD-10-CM | POA: Diagnosis not present

## 2022-09-16 DIAGNOSIS — M810 Age-related osteoporosis without current pathological fracture: Secondary | ICD-10-CM | POA: Diagnosis not present

## 2022-10-10 DIAGNOSIS — M19011 Primary osteoarthritis, right shoulder: Secondary | ICD-10-CM | POA: Diagnosis not present

## 2022-12-20 IMAGING — CT CT CARDIAC CORONARY ARTERY CALCIUM SCORE
3 series · 13 of 20 positions shown, 15 images · non-contrast
Comparison: None.

CLINICAL DATA: 70-year-old Caucasian female, coronary calcium
scoring.

EXAM:
CT CARDIAC CORONARY ARTERY CALCIUM SCORE
TECHNIQUE: Non-contrast imaging through the heart was performed using
prospective ECG gating. Image post processing was performed on an
independent workstation, allowing for quantitative analysis of the
heart and coronary arteries. Note that this exam targets the heart
and the chest was not imaged in its entirety.

[Series 2: calcium scoring 2.00 qr36 bestdiast 69% hrt calciu · axial · 0.32mm/px · z∈[+1685,+1741]mm · 3 of 71 slices shown]
[im 15/71  vessel]
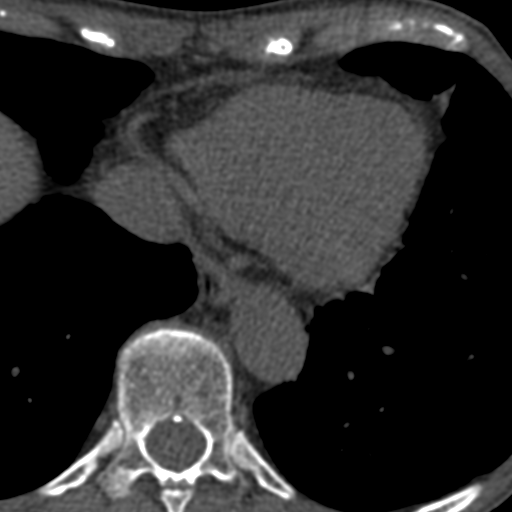
[im 29/71  vessel]
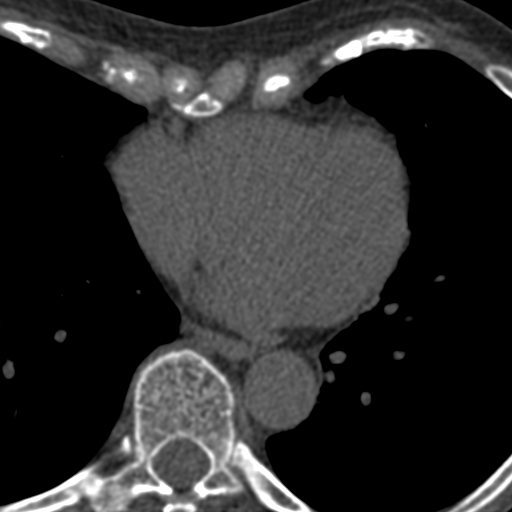
[im 43/71  vessel]
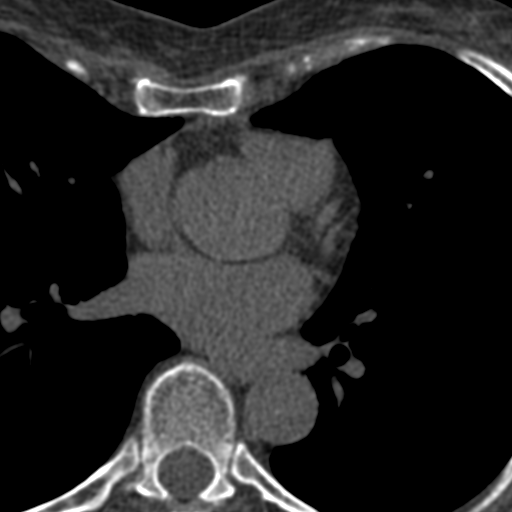

[Series 3: calcium scoring 2.00 br40 bestdiast 69% axial · axial · 0.46mm/px · z∈[+1679,+1773]mm · 5 of 71 slices shown, 7 images]
[im 12/71  vessel]
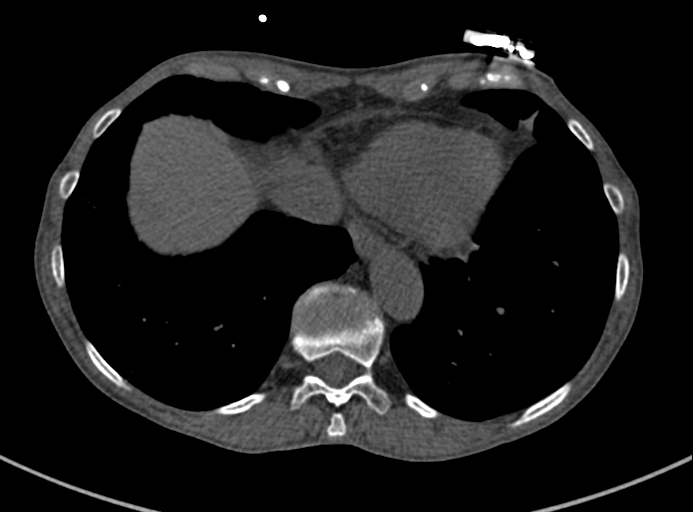
[im 12/71  lung]
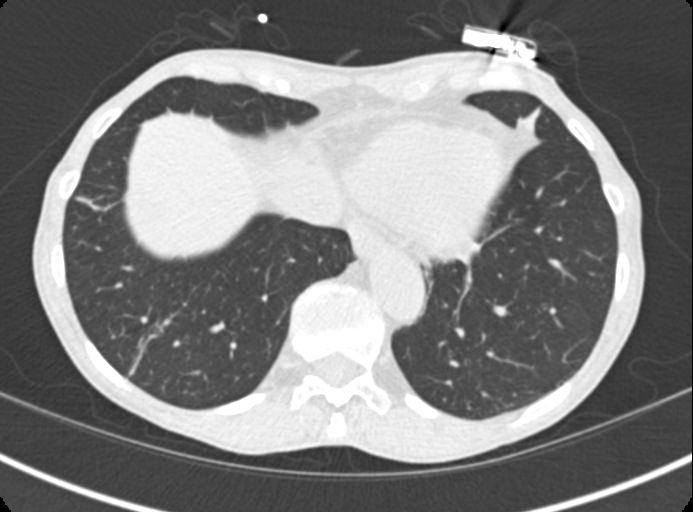
[im 24/71  vessel]
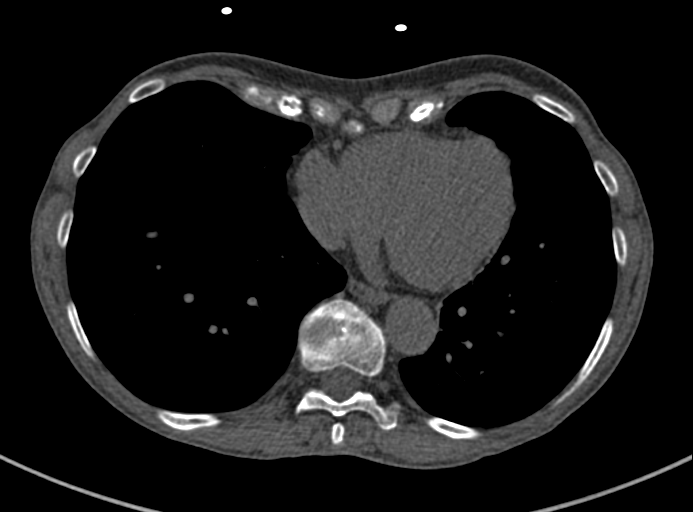
[im 36/71  vessel]
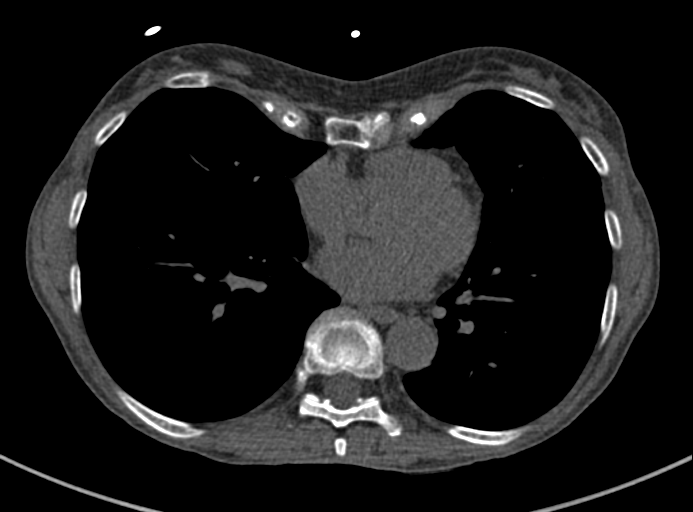
[im 47/71  vessel]
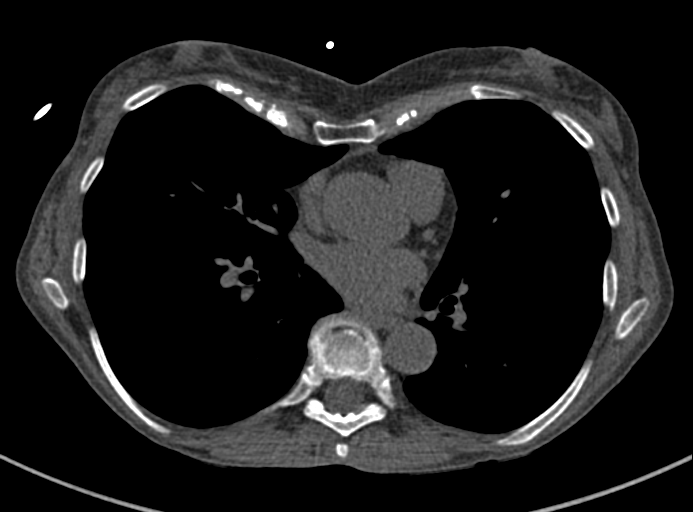
[im 59/71  vessel]
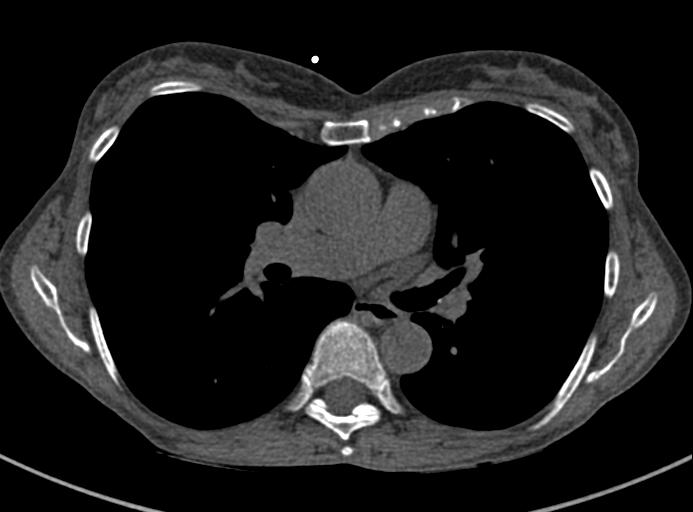
[im 59/71  lung]
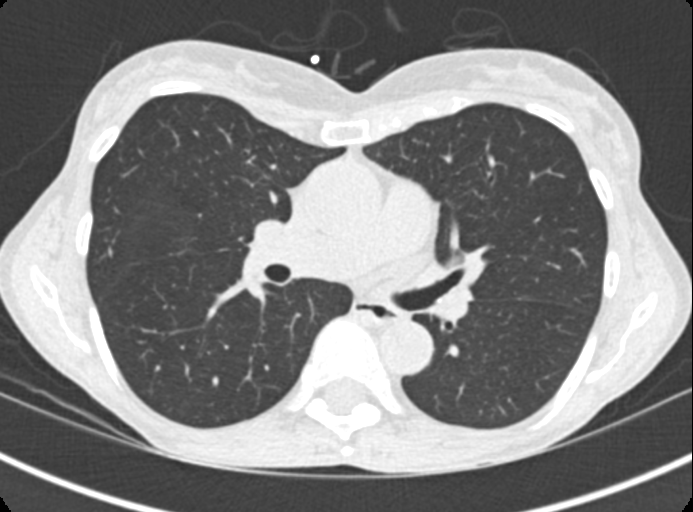

[Series 9: calcium scoring 2.00 br60 bestdiast 69% lungs · axial · 0.46mm/px · z∈[+1679,+1773]mm · 5 of 71 slices shown]
[im 12/71  vessel]
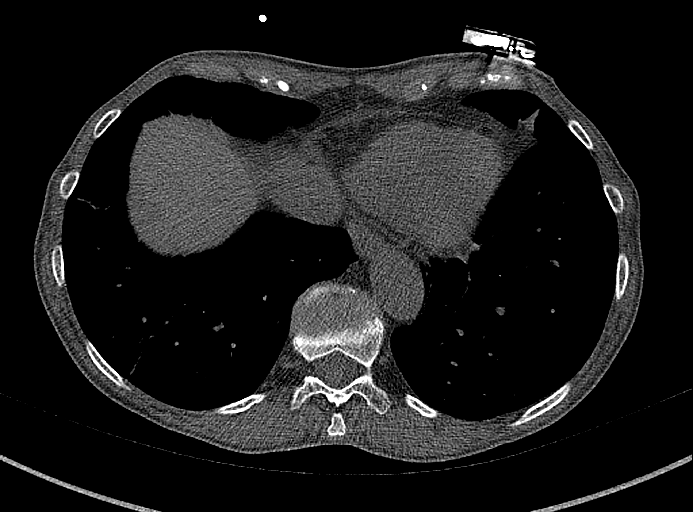
[im 24/71  vessel]
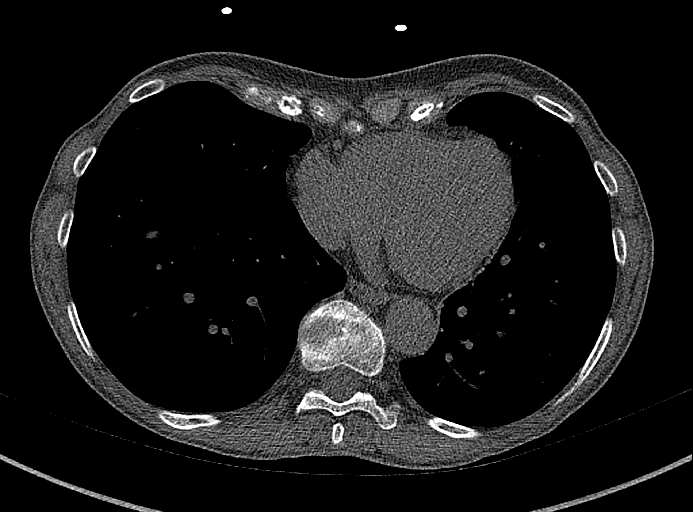
[im 36/71  vessel]
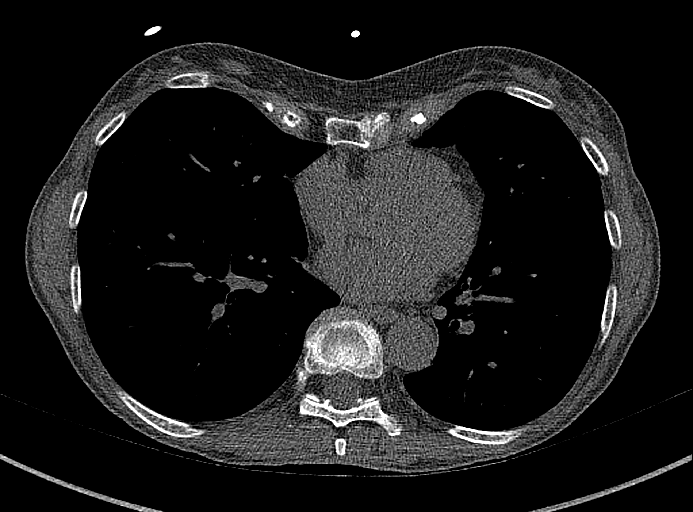
[im 47/71  vessel]
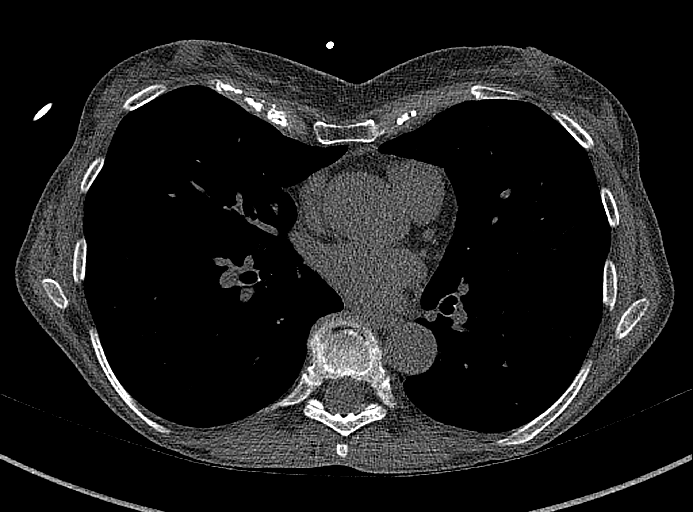
[im 59/71  vessel]
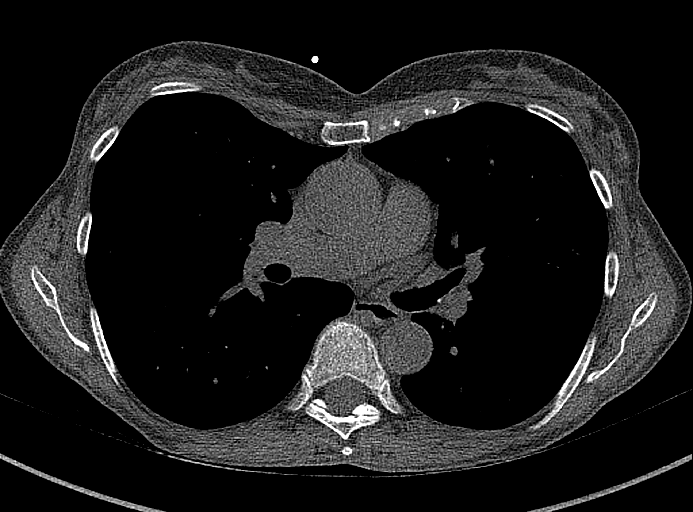

[13 of 20 positions shown; findings below may reference images not displayed]

FINDINGS: CORONARY CALCIUM SCORES:

Left Main: 0

LAD: 0

LCx: 0

RCA: 0

Total Agatston Score: 0

[HOSPITAL] percentile: Zeroth

AORTA MEASUREMENTS:

Ascending Aorta: 34 mm

Descending Aorta: 23 mm

OTHER FINDINGS:

Cardiovascular: The heart is normal in size.

Mediastinum/Nodes: Visualized esophagus and trachea within normal
limits. No mediastinal lymphadenopathy.

Lungs/Pleura: Moderate centrilobular emphysema. No suspicious
pulmonary nodules in the visualized lung fields.

Upper Abdomen: 1.1 cm simple hepatic cyst in segment 4A. The
remaining visualized upper abdomen is within normal limits.

Musculoskeletal: No acute osseous abnormality.
IMPRESSION: 1. No evidence of coronary atherosclerotic calcifications. The
observed calcium score of 0 is at the zeroth percentile for subjects
of the same age, gender, and race/ethnicity who are free of clinical
cardiovascular disease and treated diabetes.
2.  Emphysema ([id number]).

## 2023-01-04 DIAGNOSIS — M316 Other giant cell arteritis: Secondary | ICD-10-CM | POA: Diagnosis not present

## 2023-01-04 DIAGNOSIS — Z7952 Long term (current) use of systemic steroids: Secondary | ICD-10-CM | POA: Diagnosis not present

## 2023-01-04 DIAGNOSIS — K219 Gastro-esophageal reflux disease without esophagitis: Secondary | ICD-10-CM | POA: Diagnosis not present

## 2023-01-04 DIAGNOSIS — M810 Age-related osteoporosis without current pathological fracture: Secondary | ICD-10-CM | POA: Diagnosis not present

## 2023-01-04 DIAGNOSIS — M797 Fibromyalgia: Secondary | ICD-10-CM | POA: Diagnosis not present

## 2023-05-23 ENCOUNTER — Ambulatory Visit: Payer: PPO | Admitting: Internal Medicine

## 2023-07-05 DIAGNOSIS — H52203 Unspecified astigmatism, bilateral: Secondary | ICD-10-CM | POA: Diagnosis not present

## 2023-07-05 DIAGNOSIS — H353121 Nonexudative age-related macular degeneration, left eye, early dry stage: Secondary | ICD-10-CM | POA: Diagnosis not present

## 2023-07-05 DIAGNOSIS — Z961 Presence of intraocular lens: Secondary | ICD-10-CM | POA: Diagnosis not present

## 2023-07-17 ENCOUNTER — Encounter (HOSPITAL_BASED_OUTPATIENT_CLINIC_OR_DEPARTMENT_OTHER): Payer: Self-pay | Admitting: Cardiovascular Disease

## 2023-07-17 ENCOUNTER — Ambulatory Visit (HOSPITAL_BASED_OUTPATIENT_CLINIC_OR_DEPARTMENT_OTHER): Payer: PPO | Admitting: Cardiovascular Disease

## 2023-07-17 VITALS — BP 112/76 | HR 89 | Ht 61.0 in | Wt 104.3 lb

## 2023-07-17 DIAGNOSIS — Z8249 Family history of ischemic heart disease and other diseases of the circulatory system: Secondary | ICD-10-CM

## 2023-07-17 DIAGNOSIS — I491 Atrial premature depolarization: Secondary | ICD-10-CM | POA: Diagnosis not present

## 2023-07-17 DIAGNOSIS — R002 Palpitations: Secondary | ICD-10-CM

## 2023-07-17 NOTE — Progress Notes (Signed)
Cardiology Office Note:  .    Date:  07/22/2023  ID:  Alyssa Grant, Alyssa Grant Aug 12, 1951, MRN [id number] PCP: Fatima Sanger, FNP   HeartCare Providers Cardiologist:  None     History of Present Illness: .    Alyssa Grant is a 71 y.o. female with giant cell arteritis, hypothyroidism, family history of CAD, who is being seen today for the evaluation of palpitations at the request of Merri Brunette, MD.   She previously saw Dr. Rosemary Holms in 2023.  Her brother and sister both died of heart attacks.  At the time she was active and had no symptoms.  She had recently been diagnosed with giant cell arteritis 01/2021 and was treated on prednisone which was being weaned.  She was referred for coronary calcium score which was 0 on 01/2022.  She also had an exercise stress test where she achieved 5.7 METS on a Bruce protocol and had no evidence of ischemia.  She reported feeling episodes of "atrial fibrillation" though not has ever been captured on the monitor.  She was given diltiazem to take as needed.  Today, she reports that over the last 4-6 months her irregular heartbeats have become more frequent. She is able to tell as soon as her palpitations begin, which has been once daily to multiple times in a day lasting from 5-20 minutes at a time. During the episodes, the irregular heart beats are intermittent but may be in close succession. Her episodes occur randomly with or without exertion. She never tried taking the diltiazem as it was not explained why she needed the medication. While using a friend's Kardia mobile she had a tracing that reportedly showed she was in Afib. She has since obtained her own Lourena Simmonds which has shown several readings of possible Afib, unclassified, and tachycardia. On my personal review, her Kardia tracings show mostly sinus rhythm with early beats, no tracings seem to be showing Afib. She does have frequent PVC's, some PAC's. EKG performed today showed sinus  rhythm at 89 bpm with ventricular couplets and PVC's. In the office her blood pressure is 112/76. Her blood pressures are frequently on the lower end at home, sometimes as low as upper 90s/60s. For exercise, she works, walks, and completes a lot of yard work including pruning and cutting down trees with a chainsaw. Generally she feels fine with activity. If she has shortness of breath it is usually in the mornings, after walking up and down the long driveway to get the paper. Occasionally she has felt lightheaded while driving in the car, but never felt presyncopal. Typically she is following a mixed pescatarian and vegetarian diet. In the mornings she has one cup of coffee, and with dinner she has a glass of tea. She confirms snoring, sometimes waking herself up. She always wakes up with a dry mouth. When she wakes up she sometimes feels rested, sometimes gets up then feels like she needs to lie back down for 15 minutes. Otherwise she denies daytime somnolence. About 5 years ago she was diagnosed with giant cell arteritis, treated with prednisone. She notes that her Sed rate went up with trying to taper the prednisone dose. Also, she reports that last year her endocrinologist reduced Cytomel from 25 to 5 mcg. She has been on thyroid medications since her late 20's. She denies any chest pain, peripheral edema, headaches, syncope, orthopnea, or PND.  ROS:  Please see the history of present illness. All other systems are reviewed and negative.  (+)  Palpitations (+) Exertional shortness of breath (+) Occasional lightheadedness (+) Snoring  Studies Reviewed: Marland Kitchen   EKG Interpretation Date/Time:  Wednesday July 17 2023 15:43:32 EDT Ventricular Rate:  89 PR Interval:  146 QRS Duration:  70 QT Interval:  370 QTC Calculation: 450 R Axis:   60  Text Interpretation: Sinus rhythm with frequent and consecutive Premature ventricular complexes No previous ECGs available Confirmed by Chilton Si ([id number])  on 07/22/2023 8:29:54 AM   EKG 07/17/23: Sinus rhythm.  Rate 89 bpm.  Frequent PVCs.  Exercise treadmill stress test 02/23/2022: Exercise treadmill stress test performed using Bruce protocol.  Patient reached 5.7 METS, and 115% of age predicted maximum heart rate.  Exercise capacity was low.  No chest pain reported.  Exaggerated heart rate response, normal blood pressure response. Stress EKG revealed no ischemic changes. Low risk study.  Mobile cardiac telemetry 14 days 01/19/2022 - 02/02/2022: Dominant rhythm: Sinus. HR 51-159 bpm. Avg HR 88 bpm, in sinus rhythm. 63 episodes of SVT, fastest at 210 bpm for 7 beats, longest for 19 beats at 155 bpm. 3.9% isolated SVE, <1% couplet/triplets. 0 episodes of VT <1% isolated VE, couplets No atrial fibrillation/atrial flutter/VT/high grade AV block, sinus pause >3sec noted. 26 patient triggered events, correlated with SVE/VE/SVT.  Risk Assessment/Calculations:        Physical Exam:    VS:  BP 112/76 (BP Location: Right Arm, Patient Position: Sitting, Cuff Size: Normal)   Pulse 89   Ht 5\' 1"  (1.549 m)   Wt 104 lb 4.8 oz (47.3 kg)   BMI 19.71 kg/m  , BMI Body mass index is 19.71 kg/m. GENERAL:  Well appearing HEENT: Pupils equal round and reactive, fundi not visualized, oral mucosa unremarkable NECK:  No jugular venous distention, waveform within normal limits, carotid upstroke brisk and symmetric, no bruits, no thyromegaly LUNGS:  Clear to auscultation bilaterally HEART:  Regular with frequent ectopy.  PMI not displaced or sustained,S1 and S2 within normal limits, no S3, no S4, no clicks, no rubs, no murmurs ABD:  Flat, positive bowel sounds normal in frequency in pitch, no bruits, no rebound, no guarding, no midline pulsatile mass, no hepatomegaly, no splenomegaly EXT:  2 plus pulses throughout, no edema, no cyanosis no clubbing SKIN:  No rashes no nodules NEURO:  Cranial nerves II through XII grossly intact, motor grossly intact  throughout PSYCH:  Cognitively intact, oriented to person place and time  Wt Readings from Last 3 Encounters:  07/17/23 104 lb 4.8 oz (47.3 kg)  03/09/22 99 lb 6.4 oz (45.1 kg)  01/19/22 100 lb (45.4 kg)     ASSESSMENT AND PLAN: .    # Premature Ventricular Contractions (PVCs) and Premature Atrial Contractions (PACs) Daily episodes of palpitations lasting up to 20 minutes. Episodes are not exertion-related and are associated with lightheadedness. No syncope. EKG shows frequent PVCs and PACs, but less than 1% of total beats. No evidence of AFib. -Order echocardiogram to assess cardiac structure and function. -Check TSH, T3, free T4, CMP, CBC, and magnesium levels to rule out metabolic causes. -Consider medication for symptom control if underlying causes are ruled out and symptoms persist.  # Hypothyroidism: Patient reports a reduction in Cytomel dosage last year without any monitoring since that time. Increase in frequency of PVCs and PACs since dosage reduction. -Check TSH, T3, free T4 levels. -Refer to primary care provider or endocrinologist for potential adjustment of thyroid medication based on lab results.  # Family History of Heart Disease Strong family history of  heart disease. Patient has been proactive in maintaining a healthy lifestyle. Recent calcium score of zero and good cholesterol levels. -Continue current lifestyle modifications. -Repeat calcium score in 3-5 years to monitor for development of plaque.  # Steroid Use for Arthritis Patient on low maintenance dose of prednisone. No current issues reported. -Continue current regimen under the care of rheumatologist.  Follow-up in approximately one month after lab results and echocardiogram are available.      Dispo:  FU with APP/Geonna Lockyer C. Duke Salvia, MD, [facility] in 1 month.  I,Mathew Stumpf,acting as a Neurosurgeon for Chilton Si, MD.,have documented all relevant documentation on the behalf of Chilton Si, MD,as  directed by  Chilton Si, MD while in the presence of Chilton Si, MD.  I, Dalphine Cowie C. Duke Salvia, MD have reviewed all documentation for this visit.  The documentation of the exam, diagnosis, procedures, and orders on 07/22/2023 are all accurate and complete.   Signed, Chilton Si, MD

## 2023-07-17 NOTE — Progress Notes (Deleted)
Cardiology Office Note:  .   Date:  07/17/2023  ID:  Alyssa Grant, DOB June 08, 1951, MRN [id number] PCP: Merri Brunette, MD  [facility] Providers Cardiologist:  None { Click to update primary MD,subspecialty MD or APP then REFRESH:1}   History of Present Illness: .   Alyssa Grant is a 72 y.o. female with giant cell arteritis, hypothyroidism, family history of CAD who is being seen today for the evaluation of *** at the request of Merri Brunette, MD.    She previously saw Dr. Rosemary Holms in 2023.  Her brother and sister both died of heart attacks.  At the time she was active and had no symptoms.  She had recently been diagnosed with giant cell arteritis 01/2021 and was treated on prednisone which was being weaned.  She was referred for coronary calcium score which was 0 on 01/2022.  She also had an exercise stress test where she achieved 5.7 METS on a Bruce protocol and had no evidence of ischemia.  She reported feeling episodes of "atrial fibrillation" though not has ever been captured on the monitor.  She was given diltiazem to take as needed. ROS: ***  Studies Reviewed: .        *** Risk Assessment/Calculations:   {Does this patient have ATRIAL FIBRILLATION?:[phone]} No BP recorded.  {Refresh Note OR Click here to enter BP  :1}***       Physical Exam:   VS:  There were no vitals taken for this visit.   Wt Readings from Last 3 Encounters:  03/09/22 99 lb 6.4 oz (45.1 kg)  01/19/22 100 lb (45.4 kg)  12/04/18 114 lb (51.7 kg)    GEN: Well nourished, well developed in no acute distress NECK: No JVD; No carotid bruits CARDIAC: ***RRR, no murmurs, rubs, gallops RESPIRATORY:  Clear to auscultation without rales, wheezing or rhonchi  ABDOMEN: Soft, non-tender, non-distended EXTREMITIES:  No edema; No deformity   ASSESSMENT AND PLAN: .   ***    {Are you ordering a CV Procedure (e.g. stress test, cath, DCCV, TEE, etc)?   Press F2        :[id number]}  Dispo:  ***  Signed, Chilton Si, MD

## 2023-07-17 NOTE — Patient Instructions (Signed)
Medication Instructions:   *If you need a refill on your cardiac medications before your next appointment, please call your pharmacy*   Lab Work: TSH, T3, FREE T4, CMET, CBC, MAG  If you have labs (blood work) drawn today and your tests are completely normal, you will receive your results only by: MyChart Message (if you have MyChart) OR A paper copy in the mail If you have any lab test that is abnormal or we need to change your treatment, we will call you to review the results.   Testing/Procedures: Your physician has requested that you have an echocardiogram. Echocardiography is a painless test that uses sound waves to create images of your heart. It provides your doctor with information about the size and shape of your heart and how well your heart's chambers and valves are working. This procedure takes approximately one hour. There are no restrictions for this procedure. Please do NOT wear cologne, perfume, aftershave, or lotions (deodorant is allowed). Please arrive 15 minutes prior to your appointment time.    Follow-Up: At [facility], you and your health needs are our priority.  As part of our continuing mission to provide you with exceptional heart care, we have created designated Provider Care Teams.  These Care Teams include your primary Cardiologist (physician) and Advanced Practice Providers (APPs -  Physician Assistants and Nurse Practitioners) who all work together to provide you with the care you need, when you need it.  We recommend signing up for the patient portal called "MyChart".  Sign up information is provided on this After Visit Summary.  MyChart is used to connect with patients for Virtual Visits (Telemedicine).  Patients are able to view lab/test results, encounter notes, upcoming appointments, etc.  Non-urgent messages can be sent to your provider as well.   To learn more about what you can do with MyChart, go to [email or web address].    Your next  appointment:   1 month(s)  Provider:   Chilton Si, MD OR APP     Other Instructions

## 2023-07-18 ENCOUNTER — Telehealth: Payer: Self-pay | Admitting: Cardiovascular Disease

## 2023-07-18 NOTE — Telephone Encounter (Signed)
Chart updated to reflect new PCP.   Alyssa Sorrow, NP

## 2023-07-18 NOTE — Telephone Encounter (Signed)
Patient states she no longer sees Merri Brunette, MD. Her new PCP is Lauretta Chester, NP of [facility]. She would like to have this updated in her chart incase Dr. Duke Salvia needs to send results.

## 2023-07-22 ENCOUNTER — Encounter (HOSPITAL_BASED_OUTPATIENT_CLINIC_OR_DEPARTMENT_OTHER): Payer: Self-pay | Admitting: Cardiovascular Disease

## 2023-07-29 DIAGNOSIS — Z0000 Encounter for general adult medical examination without abnormal findings: Secondary | ICD-10-CM | POA: Diagnosis not present

## 2023-07-29 DIAGNOSIS — G4700 Insomnia, unspecified: Secondary | ICD-10-CM | POA: Diagnosis not present

## 2023-07-29 DIAGNOSIS — R5383 Other fatigue: Secondary | ICD-10-CM | POA: Diagnosis not present

## 2023-08-05 DIAGNOSIS — M810 Age-related osteoporosis without current pathological fracture: Secondary | ICD-10-CM | POA: Diagnosis not present

## 2023-08-05 DIAGNOSIS — E559 Vitamin D deficiency, unspecified: Secondary | ICD-10-CM | POA: Diagnosis not present

## 2023-08-05 DIAGNOSIS — Z7952 Long term (current) use of systemic steroids: Secondary | ICD-10-CM | POA: Diagnosis not present

## 2023-08-05 DIAGNOSIS — E039 Hypothyroidism, unspecified: Secondary | ICD-10-CM | POA: Diagnosis not present

## 2023-08-05 DIAGNOSIS — Z0000 Encounter for general adult medical examination without abnormal findings: Secondary | ICD-10-CM | POA: Diagnosis not present

## 2023-08-08 ENCOUNTER — Ambulatory Visit (HOSPITAL_BASED_OUTPATIENT_CLINIC_OR_DEPARTMENT_OTHER): Payer: PPO

## 2023-08-08 DIAGNOSIS — I491 Atrial premature depolarization: Secondary | ICD-10-CM

## 2023-08-08 DIAGNOSIS — R002 Palpitations: Secondary | ICD-10-CM | POA: Diagnosis not present

## 2023-08-08 DIAGNOSIS — Z8249 Family history of ischemic heart disease and other diseases of the circulatory system: Secondary | ICD-10-CM

## 2023-08-08 LAB — ECHOCARDIOGRAM COMPLETE
Area-P 1/2: 3.85 cm2
MV M vel: 3.33 m/s
MV Peak grad: 44.4 mm[Hg]
S' Lateral: 1.64 cm

## 2023-08-09 LAB — COMPREHENSIVE METABOLIC PANEL
ALT: 17 [IU]/L (ref 0–32)
AST: 26 [IU]/L (ref 0–40)
Albumin: 4.3 g/dL (ref 3.8–4.8)
Alkaline Phosphatase: 63 [IU]/L (ref 44–121)
BUN/Creatinine Ratio: 18 (ref 12–28)
BUN: 14 mg/dL (ref 8–27)
Bilirubin Total: 1.2 mg/dL (ref 0.0–1.2)
CO2: 23 mmol/L (ref 20–29)
Calcium: 9.6 mg/dL (ref 8.7–10.3)
Chloride: 102 mmol/L (ref 96–106)
Creatinine, Ser: 0.79 mg/dL (ref 0.57–1.00)
Globulin, Total: 2.8 g/dL (ref 1.5–4.5)
Glucose: 80 mg/dL (ref 70–99)
Potassium: 4.2 mmol/L (ref 3.5–5.2)
Sodium: 141 mmol/L (ref 134–144)
Total Protein: 7.1 g/dL (ref 6.0–8.5)
eGFR: 79 mL/min/{1.73_m2} (ref >59)

## 2023-08-09 LAB — CBC
Hematocrit: 46 % (ref 34.0–46.6)
Hemoglobin: 15.3 g/dL (ref 11.1–15.9)
MCH: 32.8 pg (ref 26.6–33.0)
MCHC: 33.3 g/dL (ref 31.5–35.7)
MCV: 99 fL — ABNORMAL HIGH (ref 79–97)
Platelets: 151 10*3/uL (ref 150–450)
RBC: 4.67 x10E6/uL (ref 3.77–5.28)
RDW: 11.9 % (ref 11.7–15.4)
WBC: 5.9 10*3/uL (ref 3.4–10.8)

## 2023-08-09 LAB — T4, FREE: Free T4: 1.08 ng/dL (ref 0.82–1.77)

## 2023-08-09 LAB — MAGNESIUM: Magnesium: 2.2 mg/dL (ref 1.6–2.3)

## 2023-08-09 LAB — TSH: TSH: 0.77 u[IU]/mL (ref 0.450–4.500)

## 2023-08-09 LAB — T3: T3, Total: 179 ng/dL (ref 71–180)

## 2023-08-16 ENCOUNTER — Telehealth (HOSPITAL_BASED_OUTPATIENT_CLINIC_OR_DEPARTMENT_OTHER): Payer: Self-pay

## 2023-08-16 ENCOUNTER — Telehealth: Payer: Self-pay | Admitting: Cardiology

## 2023-08-16 DIAGNOSIS — R002 Palpitations: Secondary | ICD-10-CM

## 2023-08-16 DIAGNOSIS — Z8249 Family history of ischemic heart disease and other diseases of the circulatory system: Secondary | ICD-10-CM

## 2023-08-16 DIAGNOSIS — I491 Atrial premature depolarization: Secondary | ICD-10-CM

## 2023-08-16 NOTE — Telephone Encounter (Signed)
Hey there, I see that you had tried to call patient about echo ordered by MD

## 2023-08-16 NOTE — Telephone Encounter (Signed)
Returned call to patient and provided recommendations to patient, she verbalizes understanding.     "Am not really sure what to make of that.  Given that her calcium score is 0 just 1 year ago, and that her symptoms are very atypical, I would really only that is her heart.  It might be something neurologic.  Recommend that she see her PCP about that.  Tiffany C. Duke Salvia, MD, [facility]"

## 2023-08-16 NOTE — Telephone Encounter (Addendum)
Results called to patient who verbalizes understanding!     ----- Message from [facility] sent at 08/16/2023 10:09 AM EDT ----- Echo shows that her heart pumping function is normal.  There is some thickening of the mitral valve and it is leaking mildly.  We will just need to follow this over time.  Repeat echo in 1 year.

## 2023-08-16 NOTE — Telephone Encounter (Signed)
Patient states that she was told by another doctor to inform us before her next information.   Both of her arms down to the elbow, got so heavy that she could not lift her arms and this has happened 3 times since her visit , not a pain just heavy.

## 2023-08-16 NOTE — Telephone Encounter (Signed)
Results called to patient.

## 2023-08-16 NOTE — Telephone Encounter (Signed)
Patient returned RN's call to follow-up on test results.

## 2023-08-30 ENCOUNTER — Telehealth: Payer: Self-pay | Admitting: Cardiology

## 2023-08-30 NOTE — Telephone Encounter (Signed)
Patient calling in about her lab results. Please advise

## 2023-08-30 NOTE — Telephone Encounter (Signed)
Called patient about TSH results. She verbalized understanding. All questions have been answered.

## 2023-08-30 NOTE — Telephone Encounter (Signed)
Spoke with patient and she would like a call back to discuss her thyroid results. She states  Dr. Duke Salvia informed her that's can be the cause of her cardiac problems.. She states she is a draw bridge patient who and Dr. Duke Salvia is her cardiologist. She sates she is  not sure why she keep being transferred to [address].

## 2023-09-06 ENCOUNTER — Encounter (HOSPITAL_BASED_OUTPATIENT_CLINIC_OR_DEPARTMENT_OTHER): Payer: Self-pay | Admitting: Family

## 2023-09-06 ENCOUNTER — Ambulatory Visit (HOSPITAL_BASED_OUTPATIENT_CLINIC_OR_DEPARTMENT_OTHER): Payer: PPO | Admitting: Family

## 2023-09-06 ENCOUNTER — Other Ambulatory Visit (HOSPITAL_BASED_OUTPATIENT_CLINIC_OR_DEPARTMENT_OTHER): Payer: Self-pay

## 2023-09-06 VITALS — BP 100/80 | HR 67 | Ht 60.0 in | Wt 102.3 lb

## 2023-09-06 DIAGNOSIS — R002 Palpitations: Secondary | ICD-10-CM | POA: Diagnosis not present

## 2023-09-06 MED ORDER — COMIRNATY 30 MCG/0.3ML IM SUSY
0.3000 mL | PREFILLED_SYRINGE | Freq: Once | INTRAMUSCULAR | 0 refills | Status: AC
  Filled 2023-09-06: qty 0.3, 1d supply, fill #0

## 2023-09-06 MED ORDER — FLUAD 0.5 ML IM SUSY
0.5000 mL | PREFILLED_SYRINGE | Freq: Once | INTRAMUSCULAR | 0 refills | Status: AC
  Filled 2023-09-06: qty 0.5, 1d supply, fill #0

## 2023-09-06 NOTE — Patient Instructions (Signed)
Medication Instructions:  Your physician recommends that you continue on your current medications as directed. Please refer to the Current Medication list given to you today.  *If you need a refill on your cardiac medications before your next appointment, please call your pharmacy*  Follow-Up: At [facility], you and your health needs are our priority.  As part of our continuing mission to provide you with exceptional heart care, we have created designated Provider Care Teams.  These Care Teams include your primary Cardiologist (physician) and Advanced Practice Providers (APPs -  Physician Assistants and Nurse Practitioners) who all work together to provide you with the care you need, when you need it.  We recommend signing up for the patient portal called "MyChart".  Sign up information is provided on this After Visit Summary.  MyChart is used to connect with patients for Virtual Visits (Telemedicine).  Patients are able to view lab/test results, encounter notes, upcoming appointments, etc.  Non-urgent messages can be sent to your provider as well.   To learn more about what you can do with MyChart, go to [email or web address].    Your next appointment:   12 month(s)  Provider:   Gillian Shields, NP

## 2023-09-06 NOTE — Progress Notes (Signed)
Cardiology Office Note:  .   Date:  09/06/2023  ID:  Alyssa Grant, Alyssa Grant 1951/02/18, MRN [id number] PCP: Fatima Sanger, FNP  [facility] Providers Cardiologist:  Chilton Si, MD    History of Present Illness: .   Alyssa Grant is a 72 y.o. female with a history of giant cell arteritis, hypothyroidism, palpitations.  Family history notable for CAD with brother and sister both passing from heart attacks.  Previously saw Dr. Nathanial Millman 2023.  With April 2023 coronary calcium score of 0.  She will set exercise stress test achieving 5.7 METS on Bruce protocol with no evidence of ischemia.  She reported feeling episodes of "atrial fibrillation" though has not been captured on monitor.  Monitor 01/2022 with predominantly normal sinus rhythm with 63 episodes of SVT longest 19 beats at 155 bpm, 3.9% burden of PAC and less than 1% burden PVC.  Evaluated by Dr. Duke Salvia 07/17/2023 due to daily episodes of palpitations lasting up to 20 minutes which were nonexertional.  EKG with ventricular couplets and PVCs.  Echocardiogram 08/08/2023 normal LVEF 55-60%, mild MR..  TSH, T3, free T4, CBC, CMP, magnesium were unremarkable.  Discussed the use of AI scribe software for clinical note transcription with the patient, who gave verbal consent to proceed.  The patient, with a strong family history of heart disease, presents with palpitations that are irregular and not constant. The palpitations started last year and have been occurring intermittently since then. Overall improved from prior. Reassured by results of echocardiogram. Wonders if change in Cytomel dose contributory and plans to discuss with her endocrinologist. She also reports a heavy feeling across her shoulders and down her arms while driving, which has occurred four times since her last visit. Reassurance provided regarding 01/2022 calcium score of 0. Continues to follow a vegetarian and pescatarian diet.      ROS: Please see the  history of present illness.    All other systems reviewed and are negative.   Studies Reviewed: .        Cardiac Studies & Procedures       ECHOCARDIOGRAM  ECHOCARDIOGRAM COMPLETE 08/08/2023  Narrative ECHOCARDIOGRAM REPORT    Patient Name:   Alyssa Grant Date of Exam: 08/08/2023 Medical Rec #:  [id number]         Height:       61.0 in Accession #:    [id number]        Weight:       104.3 lb Date of Birth:  01-13-1951        BSA:          1.432 m Patient Age:    71 years          BP:           120/65 mmHg Patient Gender: F                 HR:           74 bpm. Exam Location:  Outpatient  Procedure: 2D Echo, 3D Echo, Color Doppler, Cardiac Doppler and Strain Analysis  Indications:    PVC  History:        Patient has no prior history of Echocardiogram examinations. CAD, Arrythmias:PVC and PAC; Risk Factors:Non-Smoker. Palpitations; Giant cell arteritis.  Sonographer:    Jeryl Columbia RDCS Referring Phys: [id number] [facility] CHANDRASEKHAR  IMPRESSIONS   1. Left ventricular ejection fraction, by estimation, is 55 to 60%. Left ventricular ejection fraction by 3D volume  is 55 %. The left ventricle has normal function. The left ventricle has no regional wall motion abnormalities. Left ventricular diastolic parameters were normal. 2. Right ventricular systolic function is normal. The right ventricular size is normal. There is normal pulmonary artery systolic pressure. The estimated right ventricular systolic pressure is 22.2 mmHg. 3. The mitral valve is degenerative. Mild mitral valve regurgitation. No evidence of mitral stenosis. 4. The aortic valve is tricuspid. Aortic valve regurgitation is not visualized. Aortic valve sclerosis/calcification is present, without any evidence of aortic stenosis. 5. The inferior vena cava is normal in size with greater than 50% respiratory variability, suggesting right atrial pressure of 3 mmHg.  FINDINGS Left Ventricle: Left ventricular  ejection fraction, by estimation, is 55 to 60%. Left ventricular ejection fraction by 3D volume is 55 %. The left ventricle has normal function. The left ventricle has no regional wall motion abnormalities. The left ventricular internal cavity size was normal in size. There is no left ventricular hypertrophy. Left ventricular diastolic parameters were normal. Normal left ventricular filling pressure.  Right Ventricle: The right ventricular size is normal. No increase in right ventricular wall thickness. Right ventricular systolic function is normal. There is normal pulmonary artery systolic pressure. The tricuspid regurgitant velocity is 2.19 m/s, and with an assumed right atrial pressure of 3 mmHg, the estimated right ventricular systolic pressure is 22.2 mmHg.  Left Atrium: Left atrial size was normal in size.  Right Atrium: Right atrial size was normal in size.  Pericardium: There is no evidence of pericardial effusion.  Mitral Valve: The mitral valve is degenerative in appearance. There is mild thickening of the mitral valve leaflet(s). There is mild calcification of the mitral valve leaflet(s). Mild to moderate mitral annular calcification. Mild mitral valve regurgitation. No evidence of mitral valve stenosis.  Tricuspid Valve: The tricuspid valve is normal in structure. Tricuspid valve regurgitation is mild . No evidence of tricuspid stenosis.  Aortic Valve: The aortic valve is tricuspid. Aortic valve regurgitation is not visualized. Aortic valve sclerosis/calcification is present, without any evidence of aortic stenosis.  Pulmonic Valve: The pulmonic valve was normal in structure. Pulmonic valve regurgitation is not visualized. No evidence of pulmonic stenosis.  Aorta: The aortic root is normal in size and structure.  Venous: The inferior vena cava is normal in size with greater than 50% respiratory variability, suggesting right atrial pressure of 3 mmHg.  IAS/Shunts: No atrial level  shunt detected by color flow Doppler.   LEFT VENTRICLE PLAX 2D LVIDd:         3.72 cm         Diastology LVIDs:         1.64 cm         LV e' medial:    9.79 cm/s LV PW:         0.83 cm         LV E/e' medial:  8.1 LV IVS:        0.68 cm         LV e' lateral:   14.80 cm/s LVOT diam:     2.00 cm         LV E/e' lateral: 5.3 LV SV:         54 LV SV Index:   38 LVOT Area:     3.14 cm        3D Volume EF LV 3D EF:    Left ventricul ar ejection fraction by 3D volume is 55 %.  3D Volume  EF: 3D EF:        55 % LV EDV:       77 ml LV ESV:       35 ml LV SV:        43 ml  RIGHT VENTRICLE RV Basal diam:  2.80 cm RV Mid diam:    2.28 cm RV S prime:     12.90 cm/s TAPSE (M-mode): 2.3 cm  LEFT ATRIUM           Index        RIGHT ATRIUM           Index LA diam:      2.70 cm 1.89 cm/m   RA Area:     12.20 cm LA Vol (A2C): 48.6 ml 33.93 ml/m  RA Volume:   28.00 ml  19.55 ml/m LA Vol (A4C): 14.9 ml 10.40 ml/m AORTIC VALVE LVOT Vmax:   83.90 cm/s LVOT Vmean:  57.500 cm/s LVOT VTI:    0.171 m  AORTA Ao Root diam: 3.10 cm Ao Asc diam:  3.60 cm Ao Arch diam: 2.7 cm  MITRAL VALVE               TRICUSPID VALVE MV Area (PHT): 3.85 cm    TR Peak grad:   19.2 mmHg MV Decel Time: 197 msec    TR Vmax:        219.00 cm/s MR Peak grad: 44.4 mmHg MR Vmax:      333.00 cm/s  SHUNTS MV E velocity: 79.00 cm/s  Systemic VTI:  0.17 m MV A velocity: 60.70 cm/s  Systemic Diam: 2.00 cm MV E/A ratio:  1.30  Armanda Magic MD Electronically signed by Armanda Magic MD Signature Date/Time: 08/08/2023/9:11:39 PM    Final    MONITORS  LONG TERM MONITOR (3-14 DAYS) 02/12/2022  Narrative Mobile cardiac telemetry 14 days 01/19/2022 - 02/02/2022: Dominant rhythm: Sinus. HR 51-159 bpm. Avg HR 88 bpm, in sinus rhythm. 63 episodes of SVT, fastest at 210 bpm for 7 beats, longest for 19 beats at 155 bpm. 3.9% isolated SVE, <1% couplet/triplets. 0 episodes of VT <1% isolated VE, couplets No  atrial fibrillation/atrial flutter/VT/high grade AV block, sinus pause >3sec noted. 26 patient triggered events, correlated with SVE/VE/SVT.           Risk Assessment/Calculations:             Physical Exam:   VS:  BP 100/80 (BP Location: Right Arm)   Pulse 67   Ht 5' (1.524 m)   Wt 102 lb 4.8 oz (46.4 kg)   SpO2 95%   BMI 19.98 kg/m    Wt Readings from Last 3 Encounters:  09/06/23 102 lb 4.8 oz (46.4 kg)  07/17/23 104 lb 4.8 oz (47.3 kg)  03/09/22 99 lb 6.4 oz (45.1 kg)    GEN: Well nourished, well developed in no acute distress NECK: No JVD; No carotid bruits CARDIAC: RRR, no murmurs, rubs, gallops RESPIRATORY:  Clear to auscultation without rales, wheezing or rhonchi  ABDOMEN: Soft, non-tender, non-distended EXTREMITIES:  No edema; No deformity   ASSESSMENT AND PLAN: .    Assessment and Plan    Palpitations ZIO 01/2023 predominantly NSR, 63 episodes SVT longest 19 beats at 155 bpm, 3.9% PAC, <1% PVC. Echo 08/08/23 normal LVEF, mild MR. Palpitations less frequent and not bothersome. Prefers to avoid medications. As symptoms not bothersome, no indication for treatment. Could consider PRN beta block in future if symptoms progress.   Hypothyroidism Per  endocrinology  Heavy feeling in arms while driving Patient reported a heavy feeling across shoulders and down both arms while driving, occurring four times since last visit. No associated pain. Patient has a history of adhesive capsulitis in both shoulders. 01/2022 calcium score of 0. Advise patient to continue stretching exercises when symptoms occur as this resolves symptoms. If symptoms persist, consider discussing with primary care provider for possible referral to physical therapy.             Dispo: follow up in one year  Signed, Alver Sorrow, NP

## 2023-09-08 ENCOUNTER — Other Ambulatory Visit (HOSPITAL_BASED_OUTPATIENT_CLINIC_OR_DEPARTMENT_OTHER): Payer: Self-pay

## 2023-09-13 DIAGNOSIS — E559 Vitamin D deficiency, unspecified: Secondary | ICD-10-CM | POA: Diagnosis not present

## 2023-09-13 DIAGNOSIS — M810 Age-related osteoporosis without current pathological fracture: Secondary | ICD-10-CM | POA: Diagnosis not present

## 2023-09-13 DIAGNOSIS — Z7952 Long term (current) use of systemic steroids: Secondary | ICD-10-CM | POA: Diagnosis not present

## 2023-09-13 DIAGNOSIS — K219 Gastro-esophageal reflux disease without esophagitis: Secondary | ICD-10-CM | POA: Diagnosis not present

## 2023-09-13 DIAGNOSIS — E039 Hypothyroidism, unspecified: Secondary | ICD-10-CM | POA: Diagnosis not present

## 2023-09-23 DIAGNOSIS — Z1212 Encounter for screening for malignant neoplasm of rectum: Secondary | ICD-10-CM | POA: Diagnosis not present

## 2023-09-23 DIAGNOSIS — Z1211 Encounter for screening for malignant neoplasm of colon: Secondary | ICD-10-CM | POA: Diagnosis not present

## 2023-10-02 LAB — COLOGUARD: COLOGUARD: NEGATIVE

## 2023-10-08 ENCOUNTER — Other Ambulatory Visit (HOSPITAL_BASED_OUTPATIENT_CLINIC_OR_DEPARTMENT_OTHER): Payer: Self-pay

## 2023-11-12 DIAGNOSIS — Z7952 Long term (current) use of systemic steroids: Secondary | ICD-10-CM | POA: Diagnosis not present

## 2023-11-18 DIAGNOSIS — E041 Nontoxic single thyroid nodule: Secondary | ICD-10-CM | POA: Diagnosis not present

## 2023-11-18 DIAGNOSIS — E039 Hypothyroidism, unspecified: Secondary | ICD-10-CM | POA: Diagnosis not present

## 2023-12-25 DIAGNOSIS — Z7952 Long term (current) use of systemic steroids: Secondary | ICD-10-CM | POA: Diagnosis not present

## 2023-12-25 DIAGNOSIS — K219 Gastro-esophageal reflux disease without esophagitis: Secondary | ICD-10-CM | POA: Diagnosis not present

## 2023-12-25 DIAGNOSIS — M316 Other giant cell arteritis: Secondary | ICD-10-CM | POA: Diagnosis not present

## 2023-12-25 DIAGNOSIS — M810 Age-related osteoporosis without current pathological fracture: Secondary | ICD-10-CM | POA: Diagnosis not present

## 2023-12-25 DIAGNOSIS — M797 Fibromyalgia: Secondary | ICD-10-CM | POA: Diagnosis not present

## 2024-03-24 DIAGNOSIS — Z79899 Other long term (current) drug therapy: Secondary | ICD-10-CM | POA: Diagnosis not present

## 2024-04-28 ENCOUNTER — Other Ambulatory Visit: Payer: Self-pay

## 2024-06-09 DIAGNOSIS — H04122 Dry eye syndrome of left lacrimal gland: Secondary | ICD-10-CM | POA: Diagnosis not present

## 2024-06-24 DIAGNOSIS — M797 Fibromyalgia: Secondary | ICD-10-CM | POA: Diagnosis not present

## 2024-06-24 DIAGNOSIS — E039 Hypothyroidism, unspecified: Secondary | ICD-10-CM | POA: Diagnosis not present

## 2024-06-24 DIAGNOSIS — M810 Age-related osteoporosis without current pathological fracture: Secondary | ICD-10-CM | POA: Diagnosis not present

## 2024-06-24 DIAGNOSIS — K219 Gastro-esophageal reflux disease without esophagitis: Secondary | ICD-10-CM | POA: Diagnosis not present

## 2024-06-24 DIAGNOSIS — Z7952 Long term (current) use of systemic steroids: Secondary | ICD-10-CM | POA: Diagnosis not present

## 2024-06-24 DIAGNOSIS — M316 Other giant cell arteritis: Secondary | ICD-10-CM | POA: Diagnosis not present

## 2024-07-07 DIAGNOSIS — H5213 Myopia, bilateral: Secondary | ICD-10-CM | POA: Diagnosis not present

## 2024-07-07 DIAGNOSIS — Z961 Presence of intraocular lens: Secondary | ICD-10-CM | POA: Diagnosis not present

## 2024-07-07 DIAGNOSIS — H33303 Unspecified retinal break, bilateral: Secondary | ICD-10-CM | POA: Diagnosis not present

## 2024-07-31 ENCOUNTER — Ambulatory Visit (INDEPENDENT_AMBULATORY_CARE_PROVIDER_SITE_OTHER)

## 2024-07-31 DIAGNOSIS — R002 Palpitations: Secondary | ICD-10-CM

## 2024-07-31 DIAGNOSIS — I491 Atrial premature depolarization: Secondary | ICD-10-CM | POA: Diagnosis not present

## 2024-07-31 DIAGNOSIS — Z0000 Encounter for general adult medical examination without abnormal findings: Secondary | ICD-10-CM | POA: Diagnosis not present

## 2024-07-31 DIAGNOSIS — E559 Vitamin D deficiency, unspecified: Secondary | ICD-10-CM | POA: Diagnosis not present

## 2024-07-31 DIAGNOSIS — E039 Hypothyroidism, unspecified: Secondary | ICD-10-CM | POA: Diagnosis not present

## 2024-07-31 LAB — ECHOCARDIOGRAM COMPLETE
AR max vel: 1.86 cm2
AV Area VTI: 2.53 cm2
AV Area mean vel: 2.25 cm2
AV Mean grad: 2 mmHg
AV Peak grad: 6.5 mmHg
Ao pk vel: 1.27 m/s
Area-P 1/2: 4.15 cm2
S' Lateral: 2.44 cm

## 2024-08-03 ENCOUNTER — Other Ambulatory Visit (HOSPITAL_BASED_OUTPATIENT_CLINIC_OR_DEPARTMENT_OTHER)

## 2024-08-04 ENCOUNTER — Telehealth: Payer: Self-pay | Admitting: Cardiovascular Disease

## 2024-08-04 NOTE — Telephone Encounter (Signed)
 Pt asked if her echo can sent to her PCP. Please advise.

## 2024-08-04 NOTE — Telephone Encounter (Signed)
 Tried to call patient x 4, call could not be completed  Unable to send mychart message, patient not signed up  Sent to Dr Royden who is listed as PCP

## 2024-08-07 ENCOUNTER — Other Ambulatory Visit: Payer: Self-pay | Admitting: Registered Nurse

## 2024-08-07 DIAGNOSIS — R0609 Other forms of dyspnea: Secondary | ICD-10-CM | POA: Diagnosis not present

## 2024-08-07 DIAGNOSIS — E039 Hypothyroidism, unspecified: Secondary | ICD-10-CM | POA: Diagnosis not present

## 2024-08-07 DIAGNOSIS — E559 Vitamin D deficiency, unspecified: Secondary | ICD-10-CM | POA: Diagnosis not present

## 2024-08-07 DIAGNOSIS — Z23 Encounter for immunization: Secondary | ICD-10-CM | POA: Diagnosis not present

## 2024-08-07 DIAGNOSIS — Z0000 Encounter for general adult medical examination without abnormal findings: Secondary | ICD-10-CM | POA: Diagnosis not present

## 2024-08-07 DIAGNOSIS — J40 Bronchitis, not specified as acute or chronic: Secondary | ICD-10-CM | POA: Diagnosis not present

## 2024-08-07 DIAGNOSIS — Z1231 Encounter for screening mammogram for malignant neoplasm of breast: Secondary | ICD-10-CM

## 2024-08-24 ENCOUNTER — Ambulatory Visit (HOSPITAL_BASED_OUTPATIENT_CLINIC_OR_DEPARTMENT_OTHER): Payer: Self-pay | Admitting: Family

## 2024-08-25 NOTE — Telephone Encounter (Signed)
 Returning call to nurse for results

## 2024-09-01 ENCOUNTER — Ambulatory Visit
Admission: RE | Admit: 2024-09-01 | Discharge: 2024-09-01 | Disposition: A | Discharge status: Home / Self Care | Source: Ambulatory Visit | Attending: Registered Nurse | Admitting: Registered Nurse

## 2024-09-01 DIAGNOSIS — Z1231 Encounter for screening mammogram for malignant neoplasm of breast: Secondary | ICD-10-CM | POA: Diagnosis not present

## 2024-09-04 DIAGNOSIS — J40 Bronchitis, not specified as acute or chronic: Secondary | ICD-10-CM | POA: Diagnosis not present

## 2024-09-04 DIAGNOSIS — R0609 Other forms of dyspnea: Secondary | ICD-10-CM | POA: Diagnosis not present

## 2024-09-29 DIAGNOSIS — E039 Hypothyroidism, unspecified: Secondary | ICD-10-CM | POA: Diagnosis not present

## 2024-09-29 DIAGNOSIS — M810 Age-related osteoporosis without current pathological fracture: Secondary | ICD-10-CM | POA: Diagnosis not present

## 2024-09-29 DIAGNOSIS — M797 Fibromyalgia: Secondary | ICD-10-CM | POA: Diagnosis not present

## 2024-09-29 DIAGNOSIS — Z7952 Long term (current) use of systemic steroids: Secondary | ICD-10-CM | POA: Diagnosis not present

## 2024-09-29 DIAGNOSIS — K219 Gastro-esophageal reflux disease without esophagitis: Secondary | ICD-10-CM | POA: Diagnosis not present

## 2024-09-29 DIAGNOSIS — M316 Other giant cell arteritis: Secondary | ICD-10-CM | POA: Diagnosis not present

## 2024-09-29 NOTE — Progress Notes (Unsigned)
 New Patient Pulmonology Office Visit   Subjective:  Patient ID: Alyssa Grant, female    DOB: 03-09-1951  MRN: [id number]  Referred by: Royden Ronal Czar, FNP  CC: No chief complaint on file.   HPI Alyssa Grant is a 73 y.o. female with giant cell arteritis, hypothyroidism who presents for initial evaluation of dyspnea.    {PULM QUESTIONNAIRES (Optional):33196}  ROS  Allergies: Levaquin [levofloxacin], Penicillins, and Sulfa antibiotics  Current Outpatient Medications:    aspirin 81 MG chewable tablet, , Disp: , Rfl:    calcium carbonate (OSCAL) 1500 (600 Ca) MG TABS tablet, Take by mouth 2 (two) times daily with a meal., Disp: , Rfl:    ferrous sulfate 325 (65 FE) MG tablet, Take 325 mg by mouth as needed. , Disp: , Rfl:    levothyroxine  (SYNTHROID ) 25 MCG tablet, Take 25 mcg by mouth daily., Disp: , Rfl:    liothyronine  (CYTOMEL ) 5 MCG tablet, Take 5 mcg by mouth daily., Disp: , Rfl:    Multiple Vitamin (MULTIVITAMIN) tablet, Take 1 tablet by mouth daily., Disp: , Rfl:    Multiple Vitamins-Minerals (PRESERVISION AREDS) CAPS, See admin instructions., Disp: , Rfl:    OVER THE COUNTER MEDICATION, OTC Fish Oil 320 mg EPA, 200 mg DHA taking daily, Disp: , Rfl:    OVER THE COUNTER MEDICATION, Magnesium Malate 1250 mg taking daily, Disp: , Rfl:    OVER THE COUNTER MEDICATION, Vitamin D  2000 iu taking daily, Disp: , Rfl:    POTASSIUM CHLORIDE PO, Take 550 mg by mouth 2 (two) times daily., Disp: , Rfl:    predniSONE  (DELTASONE ) 1 MG tablet, Take 2 mg by mouth daily., Disp: , Rfl:    vitamin E 400 UNIT capsule, Take 400 Units by mouth daily., Disp: , Rfl:  Past Medical History:  Diagnosis Date   Allergy    Claritin; seasonal.   Arthritis    Hands, hips, knees.  Ibuprofen PRN.   Fibromyalgia    dx by Deveschwar   Hypothyroidism    goiter; onset age 44.   Osteoporosis    RMSF [facility] spotted fever)    Temporal giant cell arteritis [facility])    Past Surgical  History:  Procedure Laterality Date   BREAST SURGERY     breast cyst resection; benign.  x 2.   Family History  Problem Relation Age of Onset   Heart disease Mother 36       AMI x 2   Hyperlipidemia Mother    COPD Mother    Stroke Mother 62   Cancer Father        prostate cancer; lung cancer   Heart disease Father    Hyperlipidemia Father    Hypertension Father    Alcohol abuse Father    Heart attack Sister    Cancer Sister 20       bone cancer? s/p radiation and chemotherapy   Hyperlipidemia Sister    Hypertension Sister    Diabetes Sister    Hyperlipidemia Sister    Hypertension Sister    Heart disease Sister 40       CABG/CAD   Anxiety disorder Sister    Heart attack Brother    Diabetes Brother    Hyperlipidemia Brother    Hypertension Brother    Stroke Brother    Heart disease Brother    Hyperlipidemia Brother    Hypertension Brother    Stroke Brother    Coronary artery disease Brother    Heart  disease Maternal Grandmother    Heart disease Maternal Grandfather    Heart disease Paternal Grandmother    Heart disease Paternal Grandfather    Social History   Socioeconomic History   Marital status: Widowed    Spouse name: Widowed   Number of children: 1   Years of education: Not on file   Highest education level: Not on file  Occupational History   Occupation: housecleaning    Comment: self employed; works 40 hours per week.  Tobacco Use   Smoking status: Never   Smokeless tobacco: Never  Vaping Use   Vaping status: Never Used  Substance and Sexual Activity   Alcohol use: No    Alcohol/week: 0.0 standard drinks of alcohol   Drug use: No   Sexual activity: Not Currently    Partners: Male    Birth control/protection: Post-menopausal  Other Topics Concern   Not on file  Social History Narrative   Marital status:  Widowed; husband died suddenly (MS, pacemaker, atrial fibrillation on Coumadin; head trauma with bleed s/p emergency surgery with persistent  bleeding).  2013.  Not dating in 2019; not interested.        Children:  1 daughter; 1 stepson; 2 stepgrandchildren.  Daughter in [place]  [place]; stepson in [place].      Lives: alone; has dog and cat.      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        Tobacco:  never       Alcohol:  Rarely; holidays      Exercise:  Walking 2 miles per day.      Seatbelts:  100%      Guns:  None      ADLs:  Independent with all ADLs; drives/cooks/finances.  No assistant devices.        Advanced Directives:  Living Will; HCPOA is daughter/[place] Guardian [phone]); FULL CODE but no prolonged measures.              Social Drivers of Corporate Investment Banker Strain: Low Risk  (01/14/2018)   Overall Financial Resource Strain (CARDIA)    Difficulty of Paying Living Expenses: Not hard at all  Food Insecurity: No Food Insecurity (07/17/2023)   Hunger Vital Sign    Worried About Running Out of Food in the Last Year: Never true    Ran Out of Food in the Last Year: Never true  Transportation Needs: No Transportation Needs (07/17/2023)   PRAPARE - Administrator, Civil Service (Medical): No    Lack of Transportation (Non-Medical): No  Physical Activity: Sufficiently Active (07/17/2023)   Exercise Vital Sign    Days of Exercise per Week: 6 days    Minutes of Exercise per Session: 40 min  Stress: No Stress Concern Present (01/14/2018)   [employer] of Occupational Health - Occupational Stress Questionnaire    Feeling of Stress : Not at all  Social Connections: Moderately Integrated (01/14/2018)   Social Connection and Isolation Panel    Frequency of Communication with Friends and Family: More than three times a week    Frequency of Social Gatherings with Friends and Family: Once a week    Attends Religious Services: More than 4 times per year    Active Member of [employer] or Organizations:  Yes    Attends Banker Meetings: More than 4 times per year  Marital Status: Widowed  Intimate Partner Violence: Not At Risk (01/14/2018)   Humiliation, Afraid, Rape, and Kick questionnaire    Fear of Current or Ex-Partner: No    Emotionally Abused: No    Physically Abused: No    Sexually Abused: No       Objective:  There were no vitals taken for this visit. {Pulm Vitals (Optional):32837}  Physical Exam  Diagnostic Review:  {Labs (Optional):32838}  CT Coronaries 01/2022: Lungs/Pleura: Moderate centrilobular emphysema. No suspicious pulmonary nodules in the visualized lung fields.    Assessment & Plan:   Assessment & Plan   No orders of the defined types were placed in this encounter.     No follow-ups on file.   Saysha Menta, MD

## 2024-10-01 ENCOUNTER — Ambulatory Visit (HOSPITAL_BASED_OUTPATIENT_CLINIC_OR_DEPARTMENT_OTHER): Admitting: Pulmonary Disease

## 2024-10-01 ENCOUNTER — Encounter (HOSPITAL_BASED_OUTPATIENT_CLINIC_OR_DEPARTMENT_OTHER): Payer: Self-pay | Admitting: Pulmonary Disease

## 2024-10-01 ENCOUNTER — Ambulatory Visit (INDEPENDENT_AMBULATORY_CARE_PROVIDER_SITE_OTHER)

## 2024-10-01 VITALS — BP 134/69 | HR 72 | Ht 60.0 in | Wt 104.0 lb

## 2024-10-01 DIAGNOSIS — R0600 Dyspnea, unspecified: Secondary | ICD-10-CM

## 2024-10-01 NOTE — Patient Instructions (Signed)
  VISIT SUMMARY: Today, we discussed your chronic cough and shortness of breath, which are likely due to a combination of asthma and emphysema. We reviewed your current use of albuterol  and planned further tests to better understand your lung condition.  YOUR PLAN: ASTHMA AND EMPHYSEMA (COPD OVERLAP): Your chronic cough, congestion, and morning shortness of breath suggest a combination of asthma and COPD. -Continue using albuterol  as needed, up to 3-4 times weekly. -If you find yourself needing albuterol  2-3 times daily, we may consider adding a maintenance inhaler. -We have ordered a chest x-ray for today. -You are scheduled for a pulmonary function test with a bronchodilator challenge. -Continue with your breathing exercises and consider lifestyle modifications to help manage your symptoms. -If your lung function is found to be moderate or severe, we may discuss pulmonary rehabilitation.

## 2024-10-05 ENCOUNTER — Ambulatory Visit: Payer: Self-pay | Admitting: Pulmonary Disease

## 2024-10-07 NOTE — Progress Notes (Signed)
Left pt message to notify

## 2024-12-02 ENCOUNTER — Ambulatory Visit (HOSPITAL_BASED_OUTPATIENT_CLINIC_OR_DEPARTMENT_OTHER): Admitting: Pulmonary Disease

## 2024-12-02 ENCOUNTER — Encounter (HOSPITAL_BASED_OUTPATIENT_CLINIC_OR_DEPARTMENT_OTHER)

## 2025-01-19 ENCOUNTER — Ambulatory Visit (HOSPITAL_BASED_OUTPATIENT_CLINIC_OR_DEPARTMENT_OTHER): Admitting: Pulmonary Disease

## 2025-01-19 ENCOUNTER — Encounter (HOSPITAL_BASED_OUTPATIENT_CLINIC_OR_DEPARTMENT_OTHER)
